# Patient Record
Sex: Female | Born: 1945 | Race: White | Hispanic: No | Smoking: Current every day smoker
Health system: Southern US, Community
[De-identification: ages and names within clinical notes are randomized; demographics above are authoritative.]

## PROBLEM LIST (undated history)

## (undated) DIAGNOSIS — F17200 Nicotine dependence, unspecified, uncomplicated: Secondary | ICD-10-CM

## (undated) DIAGNOSIS — C801 Malignant (primary) neoplasm, unspecified: Secondary | ICD-10-CM

## (undated) DIAGNOSIS — E785 Hyperlipidemia, unspecified: Secondary | ICD-10-CM

## (undated) DIAGNOSIS — I10 Essential (primary) hypertension: Secondary | ICD-10-CM

## (undated) HISTORY — PX: NO PAST SURGERIES: SHX2092

## (undated) HISTORY — DX: Essential (primary) hypertension: I10

---

## 2012-10-06 ENCOUNTER — Ambulatory Visit (INDEPENDENT_AMBULATORY_CARE_PROVIDER_SITE_OTHER): Payer: Self-pay | Admitting: Sports Medicine

## 2012-10-06 ENCOUNTER — Encounter: Payer: Self-pay | Admitting: Sports Medicine

## 2012-10-06 VITALS — BP 194/130 | Ht 60.0 in | Wt 147.0 lb

## 2012-10-06 DIAGNOSIS — M25569 Pain in unspecified knee: Secondary | ICD-10-CM

## 2012-10-06 DIAGNOSIS — M542 Cervicalgia: Secondary | ICD-10-CM

## 2012-10-06 NOTE — Progress Notes (Signed)
HPI:  Patient is a 67 y/o female with reportedly no significant PMH [facility] with complains of bilateral posterior knee pain and right arm pain.  She states that both have been hurting for over a year.  She has difficulty describing her pain.  States that right arm pain is exacerbated by lifting heavy objects and she sometimes feels that the pain starts in the right side of her neck.  Denies weakness, swelling, numbness.  Patient also complains of bilateral polpliteal pain which she states is worsened by walking up stairs and fully extending knees.  She states she has been taking Advil and Bengay with some relief in her pain.  She denies any radiation of her knee pain.  Denies numbness.  She is unable to think of any inciting injuries.  ROS: As above.  Physical exam: General: well-developed, well-nourished; NAD Right arm: Empty can, Hawkin's, O'Brien's tests negative.  Patient has full range of motion of arm and 5/5 strength.  No warmth or swelling noted.  Neurovascularly intact distally.  Patient reports mild TTP of right trapezius and right paraspinal cervical tenderness.  Spurling's test negative. Knees: Lachman's, McMurray's tests negative bilaterally.  Minimal crepitus noted on flexion and extension of knees.  Patient has 5/5 strength and 2/2 DTR's of lower extremities bilaterally.  Knees stable to varus and valgus testing.  Patient reports mild TTP of popliteal fossas bilaterally.  Neurovascularly intact distally.  A/P: Right arm pain: Working Dx - Rotator cuff tendinopathy vs cervical radiculopathy.  MSK exam of right arm and neck benign.  However will order AP/lateral views of cervical spine to further assess.  Patient has been encouraged to follow up with Rudell Cobb regarding orange card as she doe not have insurance.   Knee pain: Knee pain exam is normal, however given age and elevated BMI, possible that patient has some degree of degenerative disease.  Have ordered  AP/lateral views to evaluate knees bilaterally. Patient to return to clinic to follow up after she has procured orange card.

## 2012-10-13 ENCOUNTER — Ambulatory Visit: Payer: Self-pay | Admitting: Family Medicine

## 2015-03-21 ENCOUNTER — Emergency Department (HOSPITAL_COMMUNITY): Payer: Medicare Other

## 2015-03-21 ENCOUNTER — Emergency Department (HOSPITAL_COMMUNITY)
Admission: EM | Admit: 2015-03-21 | Discharge: 2015-03-21 | Disposition: A | Discharge status: Home / Self Care | Payer: Medicare Other | Attending: Emergency Medicine | Admitting: Emergency Medicine

## 2015-03-21 ENCOUNTER — Encounter (HOSPITAL_COMMUNITY): Payer: Self-pay | Admitting: Family Medicine

## 2015-03-21 DIAGNOSIS — I10 Essential (primary) hypertension: Secondary | ICD-10-CM

## 2015-03-21 DIAGNOSIS — R109 Unspecified abdominal pain: Secondary | ICD-10-CM | POA: Insufficient documentation

## 2015-03-21 DIAGNOSIS — E780 Pure hypercholesterolemia: Secondary | ICD-10-CM | POA: Diagnosis not present

## 2015-03-21 DIAGNOSIS — M549 Dorsalgia, unspecified: Secondary | ICD-10-CM | POA: Diagnosis not present

## 2015-03-21 DIAGNOSIS — Z79899 Other long term (current) drug therapy: Secondary | ICD-10-CM | POA: Insufficient documentation

## 2015-03-21 DIAGNOSIS — Z720 Tobacco use: Secondary | ICD-10-CM | POA: Diagnosis not present

## 2015-03-21 LAB — I-STAT CHEM 8, ED
BUN: 27 mg/dL — ABNORMAL HIGH (ref 6–20)
CREATININE: 0.9 mg/dL (ref 0.44–1.00)
Calcium, Ion: 1.18 mmol/L (ref 1.13–1.30)
Chloride: 100 mmol/L — ABNORMAL LOW (ref 101–111)
GLUCOSE: 94 mg/dL (ref 65–99)
HCT: 43 % (ref 36.0–46.0)
HEMOGLOBIN: 14.6 g/dL (ref 12.0–15.0)
POTASSIUM: 4.2 mmol/L (ref 3.5–5.1)
SODIUM: 140 mmol/L (ref 135–145)
TCO2: 27 mmol/L (ref 0–100)

## 2015-03-21 LAB — URINALYSIS, ROUTINE W REFLEX MICROSCOPIC
Bilirubin Urine: NEGATIVE
Glucose, UA: NEGATIVE mg/dL
Ketones, ur: NEGATIVE mg/dL
NITRITE: NEGATIVE
Protein, ur: 30 mg/dL — AB
SPECIFIC GRAVITY, URINE: 1.025 (ref 1.005–1.030)
UROBILINOGEN UA: 0.2 mg/dL (ref 0.0–1.0)
pH: 5.5 (ref 5.0–8.0)

## 2015-03-21 LAB — URINE MICROSCOPIC-ADD ON

## 2015-03-21 MED ORDER — ACETAMINOPHEN 500 MG PO TABS
1000.0000 mg | ORAL_TABLET | Freq: Once | ORAL | Status: AC
  Administered 2015-03-21: 1000 mg via ORAL
  Filled 2015-03-21: qty 2

## 2015-03-21 NOTE — ED Provider Notes (Signed)
CSN: [id number]     Arrival date & time 03/21/15  0804 History   First MD Initiated Contact with Patient 03/21/15 0912     Chief Complaint  Patient presents with  . Back Pain     (Consider location/radiation/quality/duration/timing/severity/associated sxs/prior Treatment) HPI Comments: 69 year old female with high blood pressure and cholesterol history presents with intermittent back pain usually worse with position the past month. Patient recently started on high blood pressure medication she is taking regularly, cigarette smoker. No surgery history no aneurysm history known, no weakness or numbness in the legs. No shortness of breath or chest pain. Blood pressure has been fluctuating. Nothing specifically improves or worsens  Patient is a 69 y.o. female presenting with back pain. The history is provided by the patient.  Back Pain Associated symptoms: no abdominal pain, no chest pain, no dysuria, no fever and no headaches     History reviewed. No pertinent past medical history. History reviewed. No pertinent past surgical history. No family history on file. History  Substance Use Topics  . Smoking status: Current Some Day Smoker    Types: Cigarettes  . Smokeless tobacco: Not on file  . Alcohol Use: Not on file   OB History    No data available     Review of Systems  Constitutional: Negative for fever and chills.  HENT: Negative for congestion.   Eyes: Negative for visual disturbance.  Respiratory: Negative for shortness of breath.   Cardiovascular: Negative for chest pain.  Gastrointestinal: Negative for vomiting and abdominal pain.  Genitourinary: Negative for dysuria and flank pain.  Musculoskeletal: Positive for back pain. Negative for neck pain and neck stiffness.  Skin: Negative for rash.  Neurological: Negative for light-headedness and headaches.      Allergies  Review of patient's allergies indicates no known allergies.  Home Medications   Prior to Admission  medications   Medication Sig Start Date End Date Taking? Authorizing Provider  ibuprofen (ADVIL,MOTRIN) 200 MG tablet Take 200 mg by mouth every 6 (six) hours as needed (pain).   Yes Historical Provider, MD  lisinopril-hydrochlorothiazide (PRINZIDE,ZESTORETIC) 20-25 MG per tablet Take 1 tablet by mouth daily. 03/10/15  Yes Historical Provider, MD  lovastatin (MEVACOR) 10 MG tablet Take 10 mg by mouth daily. 03/10/15  Yes Historical Provider, MD  Multiple Vitamins-Minerals (MULTIVITAMIN PO) Take 1 tablet by mouth daily.   Yes Historical Provider, MD  Omega-3 Fatty Acids (FISH OIL PO) Take 1 tablet by mouth 2 (two) times daily.   Yes Historical Provider, MD   BP 166/89 mmHg  Pulse 99  Temp(Src) 97.8 F (36.6 C)  Resp 17  Ht 5\' 3"  (1.6 m)  Wt 152 lb (68.947 kg)  BMI 26.93 kg/m2  SpO2 97% Physical Exam  Constitutional: She is oriented to person, place, and time. She appears well-developed and well-nourished.  HENT:  Head: Normocephalic and atraumatic.  Eyes: Conjunctivae are normal. Right eye exhibits no discharge. Left eye exhibits no discharge.  Neck: Normal range of motion. Neck supple. No tracheal deviation present.  Cardiovascular: Normal rate and regular rhythm.   Pulmonary/Chest: Effort normal and breath sounds normal.  Abdominal: Soft. She exhibits no distension. There is no tenderness. There is no guarding.  Musculoskeletal: She exhibits tenderness. She exhibits no edema.  Mild right SI joint tenderness to palpation, patient has good flexion extension. No midline lumbar tenderness  Neurological: She is alert and oriented to person, place, and time.  Reflex Scores:      Patellar reflexes are 2+  on the right side and 2+ on the left side.      Achilles reflexes are 1+ on the right side and 1+ on the left side. Patient has 5+ strength with flexion extension of major joints in the lower extremity, sensation intact and major nerve distributions  Skin: Skin is warm. No rash noted.   Psychiatric: She has a normal mood and affect.  Nursing note and vitals reviewed.   ED Course  Procedures (including critical care time) Labs Review Labs Reviewed  URINALYSIS, ROUTINE W REFLEX MICROSCOPIC (NOT AT [facility]) - Abnormal; Notable for the following:    APPearance HAZY (*)    Hgb urine dipstick TRACE (*)    Protein, ur 30 (*)    Leukocytes, UA TRACE (*)    All other components within normal limits  URINE MICROSCOPIC-ADD ON - Abnormal; Notable for the following:    Squamous Epithelial / LPF FEW (*)    Casts HYALINE CASTS (*)    All other components within normal limits  I-STAT CHEM 8, ED - Abnormal; Notable for the following:    Chloride 100 (*)    BUN 27 (*)    All other components within normal limits    Imaging Review Ct Renal Stone Study  03/21/2015   CLINICAL DATA:  Right flank pain 1 month  EXAM: CT ABDOMEN AND PELVIS WITHOUT CONTRAST  TECHNIQUE: Multidetector CT imaging of the abdomen and pelvis was performed following the standard protocol without IV contrast.  COMPARISON:  None.  FINDINGS: Lower chest: Mild scarring in the lingula. Lung bases otherwise clear  Hepatobiliary: Probable fatty infiltration of the liver without focal liver lesion. Gallbladder and bile ducts normal.  Pancreas: Negative  Spleen: Negative  Adrenals/Urinary Tract: Negative for renal obstruction. No urinary tract calculi. No mass lesion. Urinary bladder is empty and appears normal.  Stomach/Bowel: Negative for bowel obstruction. No bowel edema. Terminal ileum normal. Appendix not visualized but no edema in the right lower quadrant identified.  Vascular/Lymphatic: Mild atherosclerotic calcification in the abdominal aorta and iliac arteries without aneurysm.  Reproductive: Normal-sized uterus.  No adnexal mass.  Other: Negative for adenopathy.  No free fluid.  Musculoskeletal: Lumbar degenerative changes. Mild anterior slip L3-4. No acute bony abnormality.  IMPRESSION: Negative for urinary tract  calculi. No acute abnormality identified.   Electronically Signed   By: Franchot Gallo M.D.   On: 03/21/2015 10:01     EKG Interpretation None      MDM   Final diagnoses:  Back pain  Essential hypertension   Patient presents with elevated blood pressure and back pain. Back pain most likely musculoskeletal intermittent mild reproducible. CT scan done to look for kidney stone or AAA, no acute findings reviewed results. Vitals unremarkable except for mild elevated blood pressure for which patient has outpatient follow-up for  Patient stable for outpatient follow blood work reviewed.  Results and differential diagnosis were discussed with the patient/parent/guardian. Xrays were independently reviewed by myself.  Close follow up outpatient was discussed, comfortable with the plan.   Medications  acetaminophen (TYLENOL) tablet 1,000 mg (1,000 mg Oral Given 03/21/15 1005)    Filed Vitals:   03/21/15 0808  BP: 166/89  Pulse: 99  Temp: 97.8 F (36.6 C)  Resp: 17  Height: 5\' 3"  (1.6 m)  Weight: 152 lb (68.947 kg)  SpO2: 97%    Final diagnoses:  Back pain  Essential hypertension          Elnora Morrison, MD 03/21/15 1035

## 2015-03-21 NOTE — Discharge Instructions (Signed)
Take Tylenol as needed for pain. Follow-up closely with your primary doctor to adjust blood pressure medications.  If you were given medicines take as directed.  If you are on coumadin or contraceptives realize their levels and effectiveness is altered by many different medicines.  If you have any reaction (rash, tongues swelling, other) to the medicines stop taking and see a physician.    If your blood pressure was elevated in the ER make sure you follow up for management with a primary doctor or return for chest pain, shortness of breath or stroke symptoms.  Please follow up as directed and return to the ER or see a physician for new or worsening symptoms.  Thank you. Filed Vitals:   03/21/15 0808  BP: 166/89  Pulse: 99  Temp: 97.8 F (36.6 C)  Resp: 17  Height: 5\' 3"  (1.6 m)  Weight: 152 lb (68.947 kg)  SpO2: 97%

## 2015-03-21 NOTE — ED Notes (Signed)
SW to come talk to pt about PCP. Pt reports hers will no longer see her.

## 2015-03-21 NOTE — ED Notes (Signed)
Patient transported to CT 

## 2015-03-21 NOTE — ED Notes (Signed)
Pt. Stated, I've had back pain for about a month, and I wan to check my BP .

## 2015-07-02 ENCOUNTER — Other Ambulatory Visit: Payer: Self-pay | Admitting: Internal Medicine

## 2015-07-02 DIAGNOSIS — E2839 Other primary ovarian failure: Secondary | ICD-10-CM

## 2015-08-01 ENCOUNTER — Other Ambulatory Visit: Payer: Self-pay

## 2015-08-01 DIAGNOSIS — Z1231 Encounter for screening mammogram for malignant neoplasm of breast: Secondary | ICD-10-CM

## 2015-08-03 ENCOUNTER — Other Ambulatory Visit: Payer: Medicare Other

## 2015-09-06 ENCOUNTER — Ambulatory Visit
Admission: RE | Admit: 2015-09-06 | Discharge: 2015-09-06 | Disposition: A | Discharge status: Home / Self Care | Payer: Medicare Other | Source: Ambulatory Visit | Attending: Internal Medicine | Admitting: Internal Medicine

## 2015-09-06 ENCOUNTER — Ambulatory Visit
Admission: RE | Admit: 2015-09-06 | Discharge: 2015-09-06 | Disposition: A | Discharge status: Home / Self Care | Payer: Medicare Other | Source: Ambulatory Visit

## 2015-09-06 DIAGNOSIS — E2839 Other primary ovarian failure: Secondary | ICD-10-CM

## 2015-09-06 DIAGNOSIS — Z1231 Encounter for screening mammogram for malignant neoplasm of breast: Secondary | ICD-10-CM

## 2017-06-28 ENCOUNTER — Other Ambulatory Visit: Payer: Self-pay | Admitting: Internal Medicine

## 2017-06-28 DIAGNOSIS — L309 Dermatitis, unspecified: Secondary | ICD-10-CM

## 2017-07-15 ENCOUNTER — Other Ambulatory Visit: Payer: Self-pay | Admitting: Internal Medicine

## 2017-07-15 DIAGNOSIS — L309 Dermatitis, unspecified: Secondary | ICD-10-CM

## 2017-08-12 ENCOUNTER — Ambulatory Visit
Admission: RE | Admit: 2017-08-12 | Discharge: 2017-08-12 | Disposition: A | Discharge status: Home / Self Care | Payer: Medicare Other | Source: Ambulatory Visit | Attending: Internal Medicine | Admitting: Internal Medicine

## 2017-08-12 DIAGNOSIS — L309 Dermatitis, unspecified: Secondary | ICD-10-CM

## 2017-08-27 DIAGNOSIS — C801 Malignant (primary) neoplasm, unspecified: Secondary | ICD-10-CM

## 2017-08-27 HISTORY — DX: Malignant (primary) neoplasm, unspecified: C80.1

## 2017-09-02 ENCOUNTER — Other Ambulatory Visit: Payer: Self-pay | Admitting: General Surgery

## 2017-09-02 DIAGNOSIS — D493 Neoplasm of unspecified behavior of breast: Secondary | ICD-10-CM

## 2017-09-05 ENCOUNTER — Telehealth: Payer: Self-pay | Admitting: Hematology

## 2017-09-05 ENCOUNTER — Other Ambulatory Visit: Payer: Self-pay | Admitting: General Surgery

## 2017-09-05 DIAGNOSIS — D493 Neoplasm of unspecified behavior of breast: Secondary | ICD-10-CM

## 2017-09-05 NOTE — Telephone Encounter (Signed)
With patients daughter in regard to appointment D/T/Loc/Ph#.  Per message from Dr Burr Medico D/T

## 2017-09-09 ENCOUNTER — Other Ambulatory Visit: Payer: Medicare Other

## 2017-09-09 ENCOUNTER — Ambulatory Visit
Admission: RE | Admit: 2017-09-09 | Discharge: 2017-09-09 | Disposition: A | Discharge status: Home / Self Care | Payer: Medicare Other | Source: Ambulatory Visit | Attending: General Surgery | Admitting: General Surgery

## 2017-09-09 DIAGNOSIS — D493 Neoplasm of unspecified behavior of breast: Secondary | ICD-10-CM

## 2017-09-09 DIAGNOSIS — C50112 Malignant neoplasm of central portion of left female breast: Secondary | ICD-10-CM | POA: Insufficient documentation

## 2017-09-09 MED ORDER — GADOBENATE DIMEGLUMINE 529 MG/ML IV SOLN
14.0000 mL | Freq: Once | INTRAVENOUS | Status: AC | PRN
  Administered 2017-09-09: 14 mL via INTRAVENOUS

## 2017-09-09 NOTE — Progress Notes (Signed)
[place]  [phone] Fax:([phone]) [place] Note   Patient Care Team: Nolene Ebbs, MD as PCP - General (Internal Medicine) Fanny Skates, MD as Consulting Physician (General Surgery) Truitt Merle, MD as Consulting Physician (Hematology) 09/10/2017  REFERRAL PHYSICIAN: Dr. 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 The skin thickening and hypervascularity in the region of physical exam abnormality is suspicious for Paget's disease. No mass seen within the breast parenchyma.      08/30/2017 Pathology Results    Diagnosis Breast, biopsy, left   - PAGET'S DISEASE WITH FOCAL SUPERFICIAL INVASION AND DUCTAL CARCINOMA IN SITU INVOLVING LACTIFEROUS DUCTS - DERMAL-EPIDERMAL INTERFACE REACTION WITH LICHENOID INFLAMMATION AND SCATTERED PIGMENTED MELANOPHAGES - ER AND PR WILL BE ORDERED ON BLOCK 1A AND SEPARATELY REPORTED - SEE COMMENT      08/30/2017 Receptors her2    ER, 0% negative and PR, 0% negative, HER2, pending        09/09/2017 Initial Diagnosis    Ductal carcinoma in situ (DCIS) of left breast      09/09/2017 Imaging    BILATERAL BREAST MRI WITH AND WITHOUT CONTRAST IMPRESSION: 1. Known biopsy-proven Paget's disease of the LEFT nipple. There is associated enhancement and thickening of the left areola and periareolar skin. The periareolar skin thickening and enhancement extends approximately 3-3.5 cm medial and lateral to the nipple defining the extent of skin involvement. 2. Focal non-mass enhancement within the immediate subareolar LEFT breast, measuring 1.9 x 1.5 cm, consistent with direct subareolar extension of patient's biopsy-proven Paget's disease. 3. No evidence of additional  multifocal or multicentric disease within the left breast. 4. No evidence of malignancy within the right breast. 5. No evidence of metastatic lymphadenopathy.        HISTORY OF PRESENTING ILLNESS:  Denise Mcclure 72 y.o. 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 She presents with her daughter. She was referred her by her surgeon, Dr. Dalbert Batman. Pt first notices a skin changed 1 year ago. She noticed that her left nipple was peeling and itching with no discharge. It has not increased in since and she has no pain currently.   Pt has HTN and HLD and she takes livostin and losartan prescribed by her PCP, Dr. Margy Clarks. She also takes Fosamax weekly. She sees him every 6 months and gets routine blood work. She reports no recent abnormal results. No hx of DM, CAD, COPD, CKD and no hx of surgery. She has no FHx of CA. She does have a FHx of DM, and HTN. Pt smokes 1/4 pack of cigarettes a day, she is try to quit. She does not drink alcohol.    Pt had a mammogram on 08/12/17 with results inially revealing that the physical exam is suspicious for Paget's disease in the left Nipple/areola, the skin thickening and hypervascularity in the region of physical exam abnormality is suspicious for Paget's Disease and that there was no mass seen within the breast parenchyma. Pt had a sample biopsied on 08/26/17 with results revealing Paget's Disease with focal superficial invasion and ductal carcinoma in situ involving lactiferous ducts in her left breast.   Her breast MRI on 09/09/17 reveals known biopsy-proven Paget's disease of the LEFT nipple. There  is associated enhancement and thickening of the left areola and periareolar skin. The periareolar skin thickening and enhancement extends approximately 3-3.5 cm medial and lateral to the nipple defining the extent of skin involvement. There is focal non-mass enhancement within the immediate subareolar LEFT breast, measuring  1.9 x 1.5 cm, consistent with direct subareolar extension of patient's biopsy-proven Paget's disease. There is no evidence of additional multifocal or multicentric disease within the left breast, malignancy within the right breast or metastatic lymphadenopathy.  Prognostic indicators significant for: ER, 0% negative and PR, 0% negative  On review of systems, pt denies chest pain, abdominal pain Pertinent positives are listed and detailed within the above HPI.    MEDICAL HISTORY:  Past Medical History:  Diagnosis Date  . Hypertension     SURGICAL HISTORY: History reviewed. No pertinent surgical history.  SOCIAL HISTORY: Social History   Socioeconomic History  . Marital status: Widowed    Spouse name: Not on file  . Number of children: Not on file  . Years of education: Not on file  . Highest education level: Not on file  Social Needs  . Financial resource strain: Not on file  . Food insecurity - worry: Not on file  . Food insecurity - inability: Not on file  . Transportation needs - medical: Not on file  . Transportation needs - non-medical: Not on file  Occupational History  . Not on file  Tobacco Use  . Smoking status: Current Some Day Smoker    Packs/day: 0.25    Types: Cigarettes  . Smokeless tobacco: Never Used  Substance and Sexual Activity  . Alcohol use: No    Frequency: Never  . Drug use: Not on file  . Sexual activity: Not on file  Other Topics Concern  . Not on file  Social History Narrative  . Not on file    FAMILY HISTORY: Family History  Problem Relation Age of Onset  . Diabetes Sister   . Diabetes Brother     ALLERGIES:  has No Known Allergies.  MEDICATIONS:  Current Outpatient Medications  Medication Sig Dispense Refill  . alendronate (FOSAMAX) 70 MG tablet Take 70 mg by mouth once a week.  2  . ibuprofen (ADVIL,MOTRIN) 200 MG tablet Take 200 mg by mouth every 6 (six) hours as needed (pain).    Marland Kitchen losartan-hydrochlorothiazide (HYZAAR)  50-12.5 MG tablet Take 1 tablet by mouth daily.  1  . lovastatin (MEVACOR) 10 MG tablet Take 10 mg by mouth daily.  3  . Omega-3 Fatty Acids (FISH OIL PO) Take 1 tablet by mouth 2 (two) times daily.    . Multiple Vitamins-Minerals (MULTIVITAMIN PO) Take 1 tablet by mouth daily.    Marland Kitchen zolpidem (AMBIEN) 10 MG tablet Take 1 tablet by mouth at bedtime as needed.  2   No current facility-administered medications for this visit.     REVIEW OF SYSTEMS:   Constitutional: Denies fevers, chills or abnormal night sweats Eyes: Denies blurriness of vision, double vision or watery eyes Ears, nose, mouth, throat, and face: Denies mucositis or sore throat Respiratory: Denies cough, dyspnea or wheezes Cardiovascular: Denies palpitation, chest discomfort or lower extremity swelling Gastrointestinal:  Denies nausea, heartburn or change in bowel habits Skin: Denies abnormal skin rashes Lymphatics: Denies new lymphadenopathy or easy bruising Neurological:Denies numbness, tingling or new weaknesses Behavioral/Psych: Mood is stable, no new changes  All other systems were reviewed with the patient and are negative.  PHYSICAL EXAMINATION: ECOG PERFORMANCE STATUS: 0 - Asymptomatic  Vitals:   09/10/17 1120  BP: (!) 148/90  Pulse: 100  Resp: 17  Temp: 98.6 F (37 C)  SpO2: 97%   Filed Weights   09/10/17 1120  Weight: 159 lb 8 oz (72.3 kg)    GENERAL:alert, no distress and comfortable SKIN: skin color, texture, turgor are normal, no rashes or significant lesions EYES: normal, conjunctiva are pink and non-injected, sclera clear OROPHARYNX:no exudate, no erythema and lips, buccal mucosa, and tongue normal  NECK: supple, thyroid normal size, non-tender, without nodularity LYMPH:  no palpable lymphadenopathy in the cervical, axillary or inguinal LUNGS: clear to auscultation and percussion with normal breathing effort HEART: regular rate & rhythm and no murmurs and no lower extremity  edema ABDOMEN:abdomen soft, non-tender and normal bowel sounds Musculoskeletal:no cyanosis of digits and no clubbing  PSYCH: alert & oriented x 3 with fluent speech NEURO: no focal motor/sensory deficits BREAST: Left breast with 5 cm by 6.5 cm area of significant skin hyperpigmentation with scalping around nipple. No discharge or bleeding. No palpable masses or adenopathy in either breast and aaxilla  LABORATORY DATA:  I have reviewed the data as listed CBC Latest Ref Rng & Units 03/21/2015  Hemoglobin 12.0 - 15.0 g/dL 14.6  Hematocrit 36.0 - 46.0 % 43.0    CMP Latest Ref Rng & Units 03/21/2015  Glucose 65 - 99 mg/dL 94  BUN 6 - 20 mg/dL 27(H)  Creatinine 0.44 - 1.00 mg/dL 0.90  Sodium 135 - 145 mmol/L 140  Potassium 3.5 - 5.1 mmol/L 4.2  Chloride 101 - 111 mmol/L 100(L)     PATHOLOGY: 08/30/2017 Diagnosis Breast, biopsy, left   - PAGET'S DISEASE WITH FOCAL SUPERFICIAL INVASION AND DUCTAL CARCINOMA IN SITU INVOLVING LACTIFEROUS DUCTS - DERMAL-EPIDERMAL INTERFACE REACTION WITH LICHENOID INFLAMMATION AND SCATTERED PIGMENTED MELANOPHAGES - ER AND PR WILL BE ORDERED ON BLOCK 1A AND SEPARATELY REPORTED - SEE COMMENT  RADIOGRAPHIC STUDIES: I have personally reviewed the radiological images as listed and agreed with the findings in the report. Mr Breast Bilateral W Wo Contrast Inc Cad  Result Date: 09/09/2017 CLINICAL DATA:  Patient presented on 08/12/2017 for a diagnostic mammogram and ultrasound complaining of discoloration and scaliness of the left nipple and areola for 9-12 months. Ultrasound showed associated skin thickening at the areola with hypervascularity. Punch biopsy was recommended and subsequently performed revealing Paget's disease with focal superficial invasion and DCIS. LABS:  GFR 87, creatinine 0.7 EXAM: BILATERAL BREAST MRI WITH AND WITHOUT CONTRAST TECHNIQUE: Multiplanar, multisequence MR images of both breasts were obtained prior to and following the intravenous  administration of 14 ml of MultiHance. THREE-DIMENSIONAL MR IMAGE RENDERING ON INDEPENDENT WORKSTATION: Three-dimensional MR images were rendered by post-processing of the original MR data on an independent workstation. The three-dimensional MR images were interpreted, and findings are reported in the following complete MRI report for this study. Three dimensional images were evaluated at the independent DynaCad workstation COMPARISON:  Previous exam(s). FINDINGS: Study is slightly limited by patient motion artifact. Breast composition: b. Scattered fibroglandular tissue. Background parenchymal enhancement: Moderate. Right breast: No mass or abnormal enhancement. Left breast: There is focal non-mass enhancement within the immediate subareolar left breast, measuring 1.9 x 1.5 cm (AP by transverse dimensions), with sub threshold enhancement kinetics, consistent with subareolar extension of patient's biopsy-proven Paget's disease (series 7, image 87). There is areolar and periareolar skin enhancement, with associated skin thickening. The periareolar extension measures approximately 3-3.5 cm medial and lateral to the nipple. With motion correction on DynaCAD images, there are no  additional suspicious enhancement within the left breast. Lymph nodes: No enlarged or morphologically abnormal lymph nodes are identified within either axillary or internal mammary chain region. Ancillary findings:  None. IMPRESSION: 1. Known biopsy-proven Paget's disease of the LEFT nipple. There is associated enhancement and thickening of the left areola and periareolar skin. The periareolar skin thickening and enhancement extends approximately 3-3.5 cm medial and lateral to the nipple defining the extent of skin involvement. 2. Focal non-mass enhancement within the immediate subareolar LEFT breast, measuring 1.9 x 1.5 cm, consistent with direct subareolar extension of patient's biopsy-proven Paget's disease. 3. No evidence of additional  multifocal or multicentric disease within the left breast. 4. No evidence of malignancy within the right breast. 5. No evidence of metastatic lymphadenopathy. RECOMMENDATION: Per treatment plan for patient's known left-sided Paget's disease. BI-RADS CATEGORY  6: Known biopsy-proven malignancy. Electronically Signed   By: Franki Cabot M.D.   On: 09/09/2017 12:50   US Breast Ltd Uni Left Inc Axilla  Result Date: 08/12/2017 CLINICAL DATA:  Severe discoloration and scaliness of the left nipple and areola. EXAM: 2D DIGITAL DIAGNOSTIC LEFT MAMMOGRAM WITH CAD AND ADJUNCT TOMO ULTRASOUND LEFT BREAST COMPARISON:  Previous exam(s). ACR Breast Density Category b: There are scattered areas of fibroglandular density. FINDINGS: No mammographic abnormalities. Mammographic images were processed with CAD. On physical exam, there is severe discoloration of the left areola and nipple with scaliness. No lumps. Targeted ultrasound is performed, showing skin thickening in the areola with hypervascularity. No masses identified. IMPRESSION: The physical exam is suspicious for Paget's disease in the left nipple/areola. The skin thickening and hypervascularity in the region of physical exam abnormality is suspicious for Paget's disease. No mass seen within the breast parenchyma. RECOMMENDATION: Recommend punch biopsy of the abnormal left areola. I have discussed the findings and recommendations with the patient. Results were also provided in writing at the conclusion of the visit. If applicable, a reminder letter will be sent to the patient regarding the next appointment. BI-RADS CATEGORY  4: Suspicious. Electronically Signed   By: Dorise Bullion III M.D   On: 08/12/2017 10:22   Mm Diag Breast Tomo Bilateral  Result Date: 08/12/2017 CLINICAL DATA:  Severe discoloration and scaliness of the left nipple and areola. EXAM: 2D DIGITAL DIAGNOSTIC LEFT MAMMOGRAM WITH CAD AND ADJUNCT TOMO ULTRASOUND LEFT BREAST COMPARISON:  Previous  exam(s). ACR Breast Density Category b: There are scattered areas of fibroglandular density. FINDINGS: No mammographic abnormalities. Mammographic images were processed with CAD. On physical exam, there is severe discoloration of the left areola and nipple with scaliness. No lumps. Targeted ultrasound is performed, showing skin thickening in the areola with hypervascularity. No masses identified. IMPRESSION: The physical exam is suspicious for Paget's disease in the left nipple/areola. The skin thickening and hypervascularity in the region of physical exam abnormality is suspicious for Paget's disease. No mass seen within the breast parenchyma. RECOMMENDATION: Recommend punch biopsy of the abnormal left areola. I have discussed the findings and recommendations with the patient. Results were also provided in writing at the conclusion of the visit. If applicable, a reminder letter will be sent to the patient regarding the next appointment. BI-RADS CATEGORY  4: Suspicious. Electronically Signed   By: Dorise Bullion III M.D   On: 08/12/2017 10:22    ASSESSMENT & PLAN: 72 yo female, originally from [place]   1. Paget's disease and ductal carcinoma in situ of left breast -- I discussed her pathology and radiology findings in detail. I went over the  pathology with the patient in detail. We discussed her Paget's Disease and the non-invasive nature of the DCIS.  -Pathology results from 08/26/17 reveal Paget's Disease with focal superficial invasion and ductal carcinoma in situ involving lactiferous ducts in her left breast. I discussed that her DCIS was negative for PR and ER receptors. -Breast MRI on 09/09/17 reveals known biopsy-proven Paget's disease of the LEFT nipple. There is associated enhancement and thickening of the left areola and periareolar skin.  The image finding is consistent with Paget's disease, no additional suspicious image findings for malignancy.  malignancy. I discussed this result with pt. --I  will be meeting with surgeon and pathologist tomorrow in breast tumor board and and we will discuss the case. I again discussed that if she elects with a lumpectomy, she will probably need radiation. If she elects to have a mastectomy, she will not need radiation. I discussed recovery time and the cosmetics of both options. They will take some time to research and discuss.  -we discussed that DCIS will be completely cured by surgical resection. But she will be at high risk for future breast cancer -we also discussed that biopsy may have sampling error, and we will review her surgical path to rule out invasive cancer -due to negative ER and PR, she would not need adjuvant antiestrogen therapy  -F/u with me after surgery if surgical path shows invasive cancer, otherwise after radiation   PLAN: -Will discuss her case at breast conference tomorrow, and discuss surgical options (lumpectomy vs mastectomy with sentinel node biopsy). I spoke with Dr. Wyonia Hough today  -Will follow up after surgery or radiation    No orders of the defined types were placed in this encounter.   All questions were answered. The patient knows to call the clinic with any problems, questions or concerns. I spent 40 minutes counseling the patient face to face. The total time spent in the appointment was 77 and more than 50% was on counseling.  This document serves as a record of services personally performed by Truitt Merle, MD. It was created on her behalf by Theresia Bough, a trained medical scribe. The creation of this record is based on the scribe's personal observations and the provider's statements to them.   I have reviewed the above documentation for accuracy and completeness, and I agree with the above.      Truitt Merle, MD 09/10/2017

## 2017-09-10 ENCOUNTER — Encounter: Payer: Self-pay | Admitting: Hematology

## 2017-09-10 ENCOUNTER — Inpatient Hospital Stay: Payer: Medicare Other | Attending: Hematology | Admitting: Hematology

## 2017-09-10 ENCOUNTER — Telehealth: Payer: Self-pay | Admitting: Hematology

## 2017-09-10 DIAGNOSIS — Z171 Estrogen receptor negative status [ER-]: Secondary | ICD-10-CM | POA: Insufficient documentation

## 2017-09-10 DIAGNOSIS — Z720 Tobacco use: Secondary | ICD-10-CM | POA: Diagnosis not present

## 2017-09-10 DIAGNOSIS — D0512 Intraductal carcinoma in situ of left breast: Secondary | ICD-10-CM

## 2017-09-10 NOTE — Telephone Encounter (Signed)
No 11/5 los.  °

## 2017-09-11 ENCOUNTER — Encounter: Payer: Self-pay | Admitting: *Deleted

## 2017-09-24 ENCOUNTER — Other Ambulatory Visit: Payer: Self-pay | Admitting: General Surgery

## 2017-09-24 DIAGNOSIS — Z171 Estrogen receptor negative status [ER-]: Principal | ICD-10-CM

## 2017-09-24 DIAGNOSIS — C50112 Malignant neoplasm of central portion of left female breast: Secondary | ICD-10-CM

## 2017-09-26 ENCOUNTER — Encounter: Payer: Self-pay | Admitting: Radiation Oncology

## 2017-09-26 NOTE — Progress Notes (Signed)
Location of Breast Cancer:Central portion of left breast in female  Histology per Pathology Report:   Diagnosis  08-30-17 Breast, biopsy, left   - PAGET'S DISEASE WITH FOCAL SUPERFICIAL INVASION AND DUCTAL CARCINOMA IN SITU INVOLVING LACTIFEROUS DUCTS - DERMAL-EPIDERMAL INTERFACE REACTION WITH LICHENOID INFLAMMATION AND SCATTERED PIGMENTED MELANOPHAGES - ER AND PR WILL BE ORDERED ON BLOCK 1A AND SEPARATELY REPORTED - SEE COMMENT    Receptor Status: ER(0 % -), PR (0 % -), Her2-neu (), Ki-()  Did patient present with symptoms (if so, please note symptoms) or was this found on screening mammography?: Pt first notices a skin changed 1 year ago. She noticed that her left nipple was peeling and itching with no discharge.  Mammogram on 08/12/17 with results inially revealing that the physical exam is suspicious for Paget's disease in the left Nipple/areola.  Past/Anticipated interventions by surgeon, if any: Fanny Skates radiation referral 09-24-17  Past/Anticipated interventions by medical oncology, if any: Chemotherapy No,   09-10-17 Dr. Burr Medico Discussed at the Breast conference                                                                                                                                                                Will follow up after surgery or radiation      Lymphedema issues, if any: No Rom to right arm very good Skin to right breast normal color she hasn't had surgery. Pain issues, if any: No  SAFETY ISSUES:  Prior radiation?:No  Pacemaker/ICD? :No  Possible current pregnancy? :No  Is the patient on methotrexate? :No  Menarche 16   G 3P3  BC 20 years  Menopause 45    HRT No   Current Complaints / other details:     Wt Readings from Last 3 Encounters:  10/01/17 159 lb (72.1 kg)  09/10/17 159 lb 8 oz (72.3 kg)  03/21/15 152 lb (68.9 kg)  BP (!) 160/93 (BP Location: Right Arm, Patient Position: Sitting, Cuff Size: Normal)   Pulse 100   Temp 98.3 F  (36.8 C) (Oral)   Resp 18   Ht '5\' 3"'  (1.6 m)   Wt 159 lb (72.1 kg)   SpO2 100%   BMI 28.17 kg/m   Georgena Spurling, RN 09/26/2017,3:59 PM

## 2017-10-01 ENCOUNTER — Ambulatory Visit
Admission: RE | Admit: 2017-10-01 | Discharge: 2017-10-01 | Disposition: A | Discharge status: Home / Self Care | Payer: Medicare Other | Source: Ambulatory Visit | Attending: Radiation Oncology | Admitting: Radiation Oncology

## 2017-10-01 ENCOUNTER — Encounter: Payer: Self-pay | Admitting: Radiation Oncology

## 2017-10-01 ENCOUNTER — Other Ambulatory Visit: Payer: Self-pay

## 2017-10-01 DIAGNOSIS — Z833 Family history of diabetes mellitus: Secondary | ICD-10-CM | POA: Insufficient documentation

## 2017-10-01 DIAGNOSIS — Z79899 Other long term (current) drug therapy: Secondary | ICD-10-CM | POA: Insufficient documentation

## 2017-10-01 DIAGNOSIS — F17210 Nicotine dependence, cigarettes, uncomplicated: Secondary | ICD-10-CM | POA: Diagnosis not present

## 2017-10-01 DIAGNOSIS — C50012 Malignant neoplasm of nipple and areola, left female breast: Secondary | ICD-10-CM

## 2017-10-01 DIAGNOSIS — C50019 Malignant neoplasm of nipple and areola, unspecified female breast: Secondary | ICD-10-CM | POA: Insufficient documentation

## 2017-10-01 DIAGNOSIS — Z171 Estrogen receptor negative status [ER-]: Secondary | ICD-10-CM | POA: Diagnosis not present

## 2017-10-01 DIAGNOSIS — Z7983 Long term (current) use of bisphosphonates: Secondary | ICD-10-CM | POA: Diagnosis not present

## 2017-10-01 DIAGNOSIS — I10 Essential (primary) hypertension: Secondary | ICD-10-CM | POA: Diagnosis not present

## 2017-10-01 NOTE — Addendum Note (Signed)
Encounter addended by: Malena Edman, RN on: 10/01/2017 2:14 PM  Actions taken: Charge Capture section accepted

## 2017-10-01 NOTE — Progress Notes (Signed)
Radiation Oncology         [phone] ________________________________  Name: Denise Mcclure        MRN: [id number]  Date of Service: 10/01/2017 DOB: [birth year]  GY:BWLSLHT, Christean Grief, MD  Fanny Skates, MD     REFERRING PHYSICIAN: Fanny Skates, MD   DIAGNOSIS: There were no encounter diagnoses.   HISTORY OF PRESENT ILLNESS: Denise Mcclure is a 72 y.o. female seen in the multidisciplinary breast clinic for a new diagnosis of left breast cancer. The patient was noted to have thickening and itching of the left nipple area for about one year. She was seen for mammography and ultrasound which were negative for disease. 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 She has met with Dr. Burr Medico as well and is contemplating mastectomy versus lumpectomy with sentinel node evaluation. She has also undergone MRI scan on 09/09/17 revealing an area of non mass enhancement that measured 1.9 x 1.5 cm, and areolar and periareolar skin enhancement with associated thickening measuring 3-3.5 cm medial and lateral to the nipple.  No adenopathy was noted of either axilla, and her right breast was negative for enhancement.  She comes today to discuss the role for radiotherapy.    PREVIOUS RADIATION THERAPY: No   PAST MEDICAL HISTORY:  Past Medical History:  Diagnosis Date  . Hypertension        PAST SURGICAL HISTORY:No past surgical history on file.   FAMILY HISTORY:  Family History  Problem Relation Age of Onset  . Diabetes Sister   . Diabetes Brother      SOCIAL HISTORY:  reports that she has been smoking cigarettes.  She has been smoking about 0.25 packs per day. she has never used smokeless tobacco. She reports that she does not drink alcohol.   ALLERGIES: Patient has no known allergies.   MEDICATIONS:  Current Outpatient Medications  Medication Sig Dispense Refill  . alendronate (FOSAMAX)  70 MG tablet Take 70 mg by mouth once a week.  2  . losartan-hydrochlorothiazide (HYZAAR) 50-12.5 MG tablet Take 1 tablet by mouth daily.  1  . lovastatin (MEVACOR) 10 MG tablet Take 10 mg by mouth daily.  3  . Multiple Vitamins-Minerals (MULTIVITAMIN PO) Take 1 tablet by mouth daily.    . Omega-3 Fatty Acids (FISH OIL PO) Take 1 tablet by mouth 2 (two) times daily.    Marland Kitchen ibuprofen (ADVIL,MOTRIN) 200 MG tablet Take 200 mg by mouth every 6 (six) hours as needed (pain).    Marland Kitchen zolpidem (AMBIEN) 10 MG tablet Take 1 tablet by mouth at bedtime as needed.  2   No current facility-administered medications for this encounter.      REVIEW OF SYSTEMS: On review of systems, the patient reports that she is doing well overall. She denies any chest pain, shortness of breath, cough, fevers, chills, night sweats, unintended weight changes. She denies any bowel or bladder disturbances, and denies abdominal pain, nausea or vomiting. She denies any new musculoskeletal or joint aches or pains. A complete review of systems is obtained and is otherwise negative.     PHYSICAL EXAM:  Wt Readings from Last 3 Encounters:  10/01/17 159 lb (72.1 kg)  09/10/17 159 lb 8 oz (72.3 kg)  03/21/15 152 lb (68.9 kg)   Temp Readings from Last 3 Encounters:  10/01/17 98.3 F (36.8 C) (Oral)  09/10/17 98.6 F (37 C) (Oral)  03/21/15 97.8 F (36.6 C)  BP Readings from Last 3 Encounters:  10/01/17 (!) 160/93  09/10/17 (!) 148/90  03/21/15 136/79   Pulse Readings from Last 3 Encounters:  10/01/17 100  09/10/17 100  03/21/15 99     In general this is a well appearing [place] female in no acute distress. She is alert and oriented x4 and appropriate throughout the examination. HEENT reveals that the patient is normocephalic, atraumatic. EOMs are intact. PERRLA. Skin is intact without any evidence of gross lesions. Cardiopulmonary assessment is negative for acute distress and she exhibits normal effort.  Breast exam  is deferred to her postop visit.   ECOG = 1  0 - Asymptomatic (Fully active, able to carry on all predisease activities without restriction)  1 - Symptomatic but completely ambulatory (Restricted in physically strenuous activity but ambulatory and able to carry out work of a light or sedentary nature. For example, light housework, office work)  2 - Symptomatic, <50% in bed during the day (Ambulatory and capable of all self care but unable to carry out any work activities. Up and about more than 50% of waking hours)  3 - Symptomatic, >50% in bed, but not bedbound (Capable of only limited self-care, confined to bed or chair 50% or more of waking hours)  4 - Bedbound (Completely disabled. Cannot carry on any self-care. Totally confined to bed or chair)  5 - Death   Eustace Pen MM, Creech RH, Tormey DC, et al. [phone]). "Toxicity and response criteria of the [facility]". [place] Oncol. 5 (6): 649-55    LABORATORY DATA:  Lab Results  Component Value Date   HGB 14.6 03/21/2015   HCT 43.0 03/21/2015   Lab Results  Component Value Date   NA 140 03/21/2015   K 4.2 03/21/2015   CL 100 (L) 03/21/2015   No results found for: ALT, AST, GGT, ALKPHOS, BILITOT    RADIOGRAPHY: Mr Breast Bilateral W Wo Contrast Inc Cad  Result Date: 09/09/2017 CLINICAL DATA:  Patient presented on 08/12/2017 for a diagnostic mammogram and ultrasound complaining of discoloration and scaliness of the left nipple and areola for 9-12 months. Ultrasound showed associated skin thickening at the areola with hypervascularity. Punch biopsy was recommended and subsequently performed revealing Paget's disease with focal superficial invasion and DCIS. LABS:  GFR 87, creatinine 0.7 EXAM: BILATERAL BREAST MRI WITH AND WITHOUT CONTRAST TECHNIQUE: Multiplanar, multisequence MR images of both breasts were obtained prior to and following the intravenous administration of 14 ml of MultiHance. THREE-DIMENSIONAL MR  IMAGE RENDERING ON INDEPENDENT WORKSTATION: Three-dimensional MR images were rendered by post-processing of the original MR data on an independent workstation. The three-dimensional MR images were interpreted, and findings are reported in the following complete MRI report for this study. Three dimensional images were evaluated at the independent DynaCad workstation COMPARISON:  Previous exam(s). FINDINGS: Study is slightly limited by patient motion artifact. Breast composition: b. Scattered fibroglandular tissue. Background parenchymal enhancement: Moderate. Right breast: No mass or abnormal enhancement. Left breast: There is focal non-mass enhancement within the immediate subareolar left breast, measuring 1.9 x 1.5 cm (AP by transverse dimensions), with sub threshold enhancement kinetics, consistent with subareolar extension of patient's biopsy-proven Paget's disease (series 7, image 87). There is areolar and periareolar skin enhancement, with associated skin thickening. The periareolar extension measures approximately 3-3.5 cm medial and lateral to the nipple. With motion correction on DynaCAD images, there are no additional suspicious enhancement within the left breast. Lymph nodes: No enlarged or morphologically abnormal lymph  nodes are identified within either axillary or internal mammary chain region. Ancillary findings:  None. IMPRESSION: 1. Known biopsy-proven Paget's disease of the LEFT nipple. There is associated enhancement and thickening of the left areola and periareolar skin. The periareolar skin thickening and enhancement extends approximately 3-3.5 cm medial and lateral to the nipple defining the extent of skin involvement. 2. Focal non-mass enhancement within the immediate subareolar LEFT breast, measuring 1.9 x 1.5 cm, consistent with direct subareolar extension of patient's biopsy-proven Paget's disease. 3. No evidence of additional multifocal or multicentric disease within the left breast. 4. No  evidence of malignancy within the right breast. 5. No evidence of metastatic lymphadenopathy. RECOMMENDATION: Per treatment plan for patient's known left-sided Paget's disease. BI-RADS CATEGORY  6: Known biopsy-proven malignancy. Electronically Signed   By: Franki Cabot M.D.   On: 09/09/2017 12:50       IMPRESSION/PLAN: 1. 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 Dr. Lisbeth Renshaw discusses the pathology findings and reviews the nature of paget's disease of the breast. The consensus from the breast conference include breast conservation with lumpectomy with sentinel mapping versus mastectomy and sentinel mapping.  Her tumor does not appear to be invasive, and if she had a mastectomy, we do not anticipate her needing a course of adjuvant radiotherapy, however if she chose lumpectomy, we would recommend radiation following surgery. We discussed the risks, benefits, short, and long term effects of radiotherapy, and the patient is interested in proceeding. Dr. Lisbeth Renshaw discusses the delivery and logistics of radiotherapy and anticipates a course of 4 weeks.  If she proceeds with lumpectomy, we will see her back about 2 weeks after surgery to move forward with the simulation and planning process and anticipate starting radiotherapy about 4 weeks after surgery.   In a visit lasting 60 minutes, greater than 50% of the time was spent face to face discussing her case, and coordinating the patient's care.  The above documentation reflects my direct findings during this shared patient visit. Please see the separate note by Dr. Lisbeth Renshaw on this date for the remainder of the patient's plan of care.    Carola Rhine, PAC

## 2017-10-09 ENCOUNTER — Encounter (HOSPITAL_BASED_OUTPATIENT_CLINIC_OR_DEPARTMENT_OTHER): Payer: Self-pay | Admitting: *Deleted

## 2017-10-09 ENCOUNTER — Other Ambulatory Visit: Payer: Self-pay

## 2017-10-11 ENCOUNTER — Other Ambulatory Visit: Payer: Self-pay

## 2017-10-11 ENCOUNTER — Encounter (HOSPITAL_BASED_OUTPATIENT_CLINIC_OR_DEPARTMENT_OTHER)
Admission: RE | Admit: 2017-10-11 | Discharge: 2017-10-11 | Disposition: A | Discharge status: Home / Self Care | Payer: Medicare Other | Source: Ambulatory Visit | Attending: General Surgery | Admitting: General Surgery

## 2017-10-11 DIAGNOSIS — Z01812 Encounter for preprocedural laboratory examination: Secondary | ICD-10-CM | POA: Diagnosis present

## 2017-10-11 DIAGNOSIS — Z01810 Encounter for preprocedural cardiovascular examination: Secondary | ICD-10-CM | POA: Diagnosis present

## 2017-10-11 DIAGNOSIS — R9431 Abnormal electrocardiogram [ECG] [EKG]: Secondary | ICD-10-CM | POA: Diagnosis not present

## 2017-10-11 LAB — BASIC METABOLIC PANEL
ANION GAP: 12 (ref 5–15)
BUN: 17 mg/dL (ref 6–20)
CHLORIDE: 105 mmol/L (ref 101–111)
CO2: 25 mmol/L (ref 22–32)
Calcium: 9 mg/dL (ref 8.9–10.3)
Creatinine, Ser: 0.76 mg/dL (ref 0.44–1.00)
GFR calc non Af Amer: >60 mL/min (ref >60)
Glucose, Bld: 85 mg/dL (ref 65–99)
Potassium: 4.5 mmol/L (ref 3.5–5.1)
SODIUM: 142 mmol/L (ref 135–145)

## 2017-10-11 NOTE — Progress Notes (Signed)
Pt (and daughter, interpreter) arrived at [facility] for labs/EKG. Reviewed EKG with Dr. Conrad [place] who would like for pt's primary MD to review and give clearance prior to surgery. Pt and daughter verbalized understanding and will contact Dr. Jeanie Cooks (her primary at [facility]) to see pt ASAP. Copy of EKG given to dtr and info of what we need/fax number also given. Hibiclens given with instruction to wash with half night before and half morning of surgery and use caution in shower as floor may become slick. Ensure presurgery drink given with instruction to drink all by 0845 DOS.

## 2017-10-13 NOTE — H&P (Signed)
Denise Mcclure Location: [facility] Patient #: [id number] DOB: [birth year] Widowed / Language: Denise Mcclure / Race: Refused to Report/Unreported Female        History of Present Illness        The patient is a 72 year old female who presents with breast cancer. 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 Her PCP is Dr. Jeanie Cooks. [facility] [place]. Dr. Truitt Merle is her oncologist. Her daughter is with her throughout the encounter.      I initially evaluated her because of hyperpigmentation thickening and discoloration of the entire left nipple and areola. No prior breast problems. Mammograms and ultrasounds were negative and skin thickening. I performed a punch biopsy of the left nipple and areolar complex on August 26, 2017. 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 She has seen Dr. Burr Medico. MRI shows that the right breast is normal. All of the regional lymph nodes are normal. There is thickening of the skin of the nipple and areola and localized enhancement under the nipple and areola but this is very localized. She does not have extensive disease.      Past history unremarkable except for hypertension Family history negative for breast or ovarian cancer Social history reveals she is a widow. 3 children. Smokes cigarettes. Denies alcohol.      We had a very long discussion today to make decisions about definitive surgical therapy. We discussed mastectomy and sentinel lymph node biopsy, with or without reconstruction. We discussed central lumpectomy and sentinel lymph node biopsy and postop radiation therapy. They had numerous, numerous questions. They would like to go ahead and tentatively scheduled for left central lumpectomy removing the  nipple and areola and the deeper tissues and left axillary sentinel node biopsy. They want urgent referral to radiation oncology to make sure they understand what is involved with that since she has not seen radiation oncology All of this is reasonable.  Referral to radiation oncology completed    Allergies  No Known Drug Allergies  Allergies Reconciled   Medication History  Zolpidem Tartrate (10MG Tablet, Oral) Active. Lovastatin (10MG Tablet, Oral) Active. Chantix Continuing Month Pak (1MG Tablet, Oral) Active. Mirtazapine (30MG Tablet, Oral) Active. Lisinopril-Hydrochlorothiazide (10-12.5MG Tablet, Oral) Active. Medications Reconciled  Vitals Weight: 165.2 lb Height: 59in Body Surface Area: 1.7 m Body Mass Index: 33.37 kg/m  Temp.: 98.16F  Pulse: 117 (Regular)  BP: 140/80 (Sitting, Left Arm, Standard)    Physical Exam  General Mental Status-Alert. General Appearance-Not in acute distress. Build & Nutrition-Well nourished. Posture-Normal posture. Gait-Normal.  Head and Neck Head-normocephalic, atraumatic with no lesions or palpable masses. Trachea-midline. Thyroid Gland Characteristics - normal size and consistency and no palpable nodules.  Chest and Lung Exam Chest and lung exam reveals -on auscultation, normal breath sounds, no adventitious sounds and normal vocal resonance.  Breast Note: The entire left nipple and areola are thickened, a little bit scaly and hyperpigmented. The wound infection. No palpable mass deep in the breast. No axillary adenopathy. In the right breast lower outer quadrant there was an old sebaceous cyst. No adenopathy.   Cardiovascular Cardiovascular examination reveals -normal heart sounds, regular rate and rhythm with no murmurs and femoral artery auscultation bilaterally reveals normal pulses, no bruits, no thrills.  Abdomen Inspection Inspection of the abdomen reveals - No  Hernias. Palpation/Percussion Palpation and Percussion of the abdomen reveal - Soft, Non Tender, No Rigidity (guarding), No hepatosplenomegaly and No Palpable abdominal masses.  Neurologic Neurologic evaluation reveals -alert and oriented x 3 with no impairment of recent or remote memory, normal attention span and ability to concentrate, normal sensation and normal coordination.  Musculoskeletal Normal Exam - Bilateral-Upper Extremity Strength Normal and Lower Extremity Strength Normal.    Assessment & Plan  NEOPLASM OF NIPPLE OF LEFT FEMALE BREAST (D49.3)    Your breast MRI looks good. The cancer appears to be localized to the nipple and areola and just a short distance behind the areola. There are no abnormal lymph nodes The opposite breast is normal The chance that the cancer has spread elsewhere in her body is extremely low.  The pathology report shows that the tumor may have a microscopic area of invasion, meaning it has the potential to spread to lymph nodes For this reason we need to do a precautionary lymph node biopsy  You have met with Dr. Burr Medico, but you have not seen a radiation oncologist.  We spent a long time today discussing the options of central lumpectomy and sentinel lymph node biopsy and radiation therapy. We have compare that to mastectomy and sentinel lymph node biopsy. We have discussed the advantages and disadvantages of each form of treatment There is no survival advantage to mastectomy. The only advantage is that you would not have to have radiation therapy. My bias is to recommend the lesser surgery which is central lumpectomy, sentinel node biopsy but you must have radiation therapy in this setting to have equivalent long-term results.  After talking about this for a long time we decided to refer you for an urgent consultation with the radiation oncologist and to concurrently go ahead and tentatively schedule left breast central lumpectomy, left  axillary sentinel lymph node biopsy I discussed the indications, techniques, and risks of this surgery in detail You can always change your mind after meeting with the radiation oncologist  HYPERTENSION, ESSENTIAL ([place])    Denise Mcclure. Dalbert Batman, M.D., [facility] General and Minimally invasive Surgery Breast and Colorectal Surgery Office:   [phone] Pager:   [phone]

## 2017-10-15 NOTE — Progress Notes (Signed)
Called pt's daughter to see if pt had been evaluated by her primary MD as we discussed last week. She reports they couldn't get in Friday, office was closed Monday, and pt has been unable to get in touch with office today. Reviewed case and EKG with Dr. Ambrose Pancoast (anesthesia) who would like pt to have cardiology consult prior to surgery. Call room nurse called Dr Darrel Hoover office and notified them of this need. Returned call to daughter to tell her that Dr Darrel Hoover office will be in touch with her to arrange a cardiac consult ASAP. Daughter verbalized understanding.

## 2017-10-15 NOTE — Progress Notes (Signed)
Spoke with Dr Darrel Hoover nurse at [place], stated that patient had not been able to get in to see PCP for clearance. EKG was reviewed by anesthesia Dr Ambrose Pancoast and he states pt will need cardiology clearance before surgery. She states that she will speak to Dr Dalbert Batman and try to get pt an appt ASAP.

## 2017-10-16 ENCOUNTER — Ambulatory Visit (HOSPITAL_BASED_OUTPATIENT_CLINIC_OR_DEPARTMENT_OTHER): Admission: RE | Admit: 2017-10-16 | Payer: Medicare Other | Source: Ambulatory Visit | Admitting: General Surgery

## 2017-10-16 ENCOUNTER — Ambulatory Visit: Payer: Medicare Other | Admitting: Cardiovascular Disease

## 2017-10-16 ENCOUNTER — Other Ambulatory Visit (HOSPITAL_COMMUNITY): Payer: Medicare Other

## 2017-10-16 ENCOUNTER — Telehealth: Payer: Self-pay

## 2017-10-16 HISTORY — DX: Malignant (primary) neoplasm, unspecified: C80.1

## 2017-10-16 HISTORY — DX: Hyperlipidemia, unspecified: E78.5

## 2017-10-16 HISTORY — DX: Nicotine dependence, unspecified, uncomplicated: F17.200

## 2017-10-16 SURGERY — BREAST LUMPECTOMY WITH AXILLARY LYMPH NODE BIOPSY
Anesthesia: General | Site: Breast | Laterality: Left

## 2017-10-16 NOTE — Progress Notes (Deleted)
No chief complaint on file.   History of Present Illness: 72 yo female with history of breast cancer, HTN, HLD, tobacco abuse who is here today as a new consult, referred by Dr. Jeanie Cooks, for evaluation of *** She has been diagnosed with breast cancer in January 2019. There are plans for surgery but this was cancelled due to an abnormal EKG. Her EKG on 10/11/17 showed sinus rhythm with T wave inversions in the lateral and anterolateral leads. She is a smoker, usually smoking 1/4 packs per day. No known cardiac disease. She does have HTN and HLD.  Primary Care Physician: Nolene Ebbs, MD   Past Medical History:  Diagnosis Date  . Cancer ([place]) 08/2017   left breast cancer  . Hyperlipidemia   . Hypertension   . Smoker     Past Surgical History:  Procedure Laterality Date  . NO PAST SURGERIES      Current Outpatient Medications  Medication Sig Dispense Refill  . alendronate (FOSAMAX) 70 MG tablet Take 70 mg by mouth once a week.  2  . ibuprofen (ADVIL,MOTRIN) 200 MG tablet Take 200 mg by mouth every 6 (six) hours as needed (pain).    Marland Kitchen losartan-hydrochlorothiazide (HYZAAR) 50-12.5 MG tablet Take 1 tablet by mouth daily.  1  . lovastatin (MEVACOR) 10 MG tablet Take 10 mg by mouth daily.  3  . Multiple Vitamins-Minerals (MULTIVITAMIN PO) Take 1 tablet by mouth daily.    . Omega-3 Fatty Acids (FISH OIL PO) Take 1 tablet by mouth 2 (two) times daily.    Marland Kitchen zolpidem (AMBIEN) 10 MG tablet Take 1 tablet by mouth at bedtime as needed.  2   No current facility-administered medications for this visit.     No Known Allergies  Social History   Socioeconomic History  . Marital status: Widowed    Spouse name: Not on file  . Number of children: Not on file  . Years of education: Not on file  . Highest education level: Not on file  Social Needs  . Financial resource strain: Not on file  . Food insecurity - worry: Not on file  . Food insecurity - inability: Not on file  .  Transportation needs - medical: Not on file  . Transportation needs - non-medical: Not on file  Occupational History  . Not on file  Tobacco Use  . Smoking status: Current Some Day Smoker    Packs/day: 0.25    Types: Cigarettes  . Smokeless tobacco: Never Used  . Tobacco comment: trying to quit  Substance and Sexual Activity  . Alcohol use: No    Frequency: Never  . Drug use: No  . Sexual activity: No    Birth control/protection: Post-menopausal  Other Topics Concern  . Not on file  Social History Narrative  . Not on file    Family History  Problem Relation Age of Onset  . Diabetes Sister   . Diabetes Brother     Review of Systems:  As stated in the HPI and otherwise negative.   There were no vitals taken for this visit.  Physical Examination: General: Well developed, well nourished, NAD  HEENT: OP clear, mucus membranes moist  SKIN: warm, dry. No rashes. Neuro: No focal deficits  Musculoskeletal: Muscle strength 5/5 all ext  Psychiatric: Mood and affect normal  Neck: No JVD, no carotid bruits, no thyromegaly, no lymphadenopathy.  Lungs:Clear bilaterally, no wheezes, rhonci, crackles Cardiovascular: Regular rate and rhythm. No murmurs, gallops or rubs. Abdomen:Soft. Bowel sounds  present. Non-tender.  Extremities: No lower extremity edema. Pulses are 2 + in the bilateral DP/PT.  EKG:  EKG {ACTION; IS/IS HHI:[id number]} ordered today. The ekg ordered today demonstrates ***  Recent Labs: 10/11/2017: BUN 17; Creatinine, Ser 0.76; Potassium 4.5; Sodium 142   Lipid Panel No results found for: CHOL, TRIG, HDL, CHOLHDL, VLDL, LDLCALC, LDLDIRECT   Wt Readings from Last 3 Encounters:  10/01/17 159 lb (72.1 kg)  09/10/17 159 lb 8 oz (72.3 kg)  03/21/15 152 lb (68.9 kg)     Other studies Reviewed: Additional studies/ records that were reviewed today include: ***. Review of the above records demonstrates: ***   Assessment and Plan:   1.   Current medicines are  reviewed at length with the patient today.  The patient {ACTIONS; HAS/DOES NOT HAVE:19233} concerns regarding medicines.  The following changes have been made:  {PLAN; NO CHANGE:13088:s}  Labs/ tests ordered today include: *** No orders of the defined types were placed in this encounter.    Disposition:   FU with *** in {gen number [id number]} {TIME; UNITS DAY/WEEK/MONTH:19136}   Signed, Lauree Chandler, MD 10/16/2017 5:48 AM    [facility] [address], [place], [place]  [address] Phone: [phone]; Fax: [phone]

## 2017-10-16 NOTE — Telephone Encounter (Signed)
   [facility] Pre-operative Risk Assessment    Request for surgical clearance:  1. What type of surgery is being performed?   2. When is this surgery scheduled?   3. What type of clearance is required (medical clearance vs. Pharmacy clearance to hold med vs. Both)?    4. Are there any medications that need to be held prior to surgery and how long?  5. Practice name and name of physician performing surgery?  6. What is your office phone and fax number?   7. Anesthesia type (None, local, MAC, general) ?    Mendel Ryder 10/16/2017, 10:56 AM  _________________________________________________________________   (provider comments below)  Pt was scheduled for a new patient visit today 10/16/2017 but did not show up.  Management will contact pt for further information.

## 2017-10-17 NOTE — Telephone Encounter (Signed)
Patient didn't show for her new patient appointment yesterday.   I called Dr. Darrel Hoover office to let them know. Sunday Spillers at Dr. Harvest Dark office stated the patient's daughter is supposed to call [facility] and reschedule her mother for an appointment.  Dr. Darrel Hoover office is aware that her sx clearance is pending this appointment be rescheduled and completed.     [facility] Pre-operative Risk Assessment    Request for surgical clearance:  1. What type of surgery is being performed? Neoplasm of nipple of left female breast (D49.3)   2. When is this surgery scheduled? TBD; pending written cardiac clearance   3. What type of clearance is required (medical clearance vs. Pharmacy clearance to hold med vs. Both)? Medical clearance due to abnormal EKG on 10/11/17  4. Are there any medications that need to be held prior to surgery and how long? None indicated on fax request   5. Practice name and name of physician performing surgery? [facility] - Fanny Skates, MD   6. What is your office phone and fax number? Phone: [phone] Fax: [phone]   7. Anesthesia type (None, local, MAC, general) ? general   Crystal D Kuhn 10/17/2017, 12:10 PM  _________________________________________________________________   (provider comments below)

## 2017-10-30 ENCOUNTER — Encounter: Payer: Self-pay | Admitting: Internal Medicine

## 2017-10-31 ENCOUNTER — Ambulatory Visit: Payer: Medicare Other | Admitting: Internal Medicine

## 2017-11-07 ENCOUNTER — Ambulatory Visit (INDEPENDENT_AMBULATORY_CARE_PROVIDER_SITE_OTHER): Payer: Medicare Other | Admitting: Internal Medicine

## 2017-11-07 ENCOUNTER — Encounter: Payer: Self-pay | Admitting: Internal Medicine

## 2017-11-07 VITALS — BP 132/92 | HR 103 | Ht <= 58 in | Wt 161.0 lb

## 2017-11-07 DIAGNOSIS — Z01810 Encounter for preprocedural cardiovascular examination: Secondary | ICD-10-CM

## 2017-11-07 DIAGNOSIS — Z720 Tobacco use: Secondary | ICD-10-CM | POA: Diagnosis not present

## 2017-11-07 DIAGNOSIS — C50912 Malignant neoplasm of unspecified site of left female breast: Secondary | ICD-10-CM | POA: Diagnosis not present

## 2017-11-07 DIAGNOSIS — R9431 Abnormal electrocardiogram [ECG] [EKG]: Secondary | ICD-10-CM | POA: Diagnosis not present

## 2017-11-07 NOTE — Progress Notes (Signed)
New Outpatient Visit Date: 11/07/2017  Referring Provider: Fanny Skates, [facility] [address], [place] [address]  Chief Complaint: Breast cancer  HPI:  Denise Mcclure is a 72 y.o. female who is being seen today for preoperative cardiovascular examination at the request of Dr. Dalbert Batman. She has a history of hypertension, hyperlipidemia, and tobacco use. She was diagnosed with left breast cancer in January. Preop EKG in anticipation of breast surgery was abnormal, prompting referral to cardiology.  [name] presents with her daughter today.  She initially noticed a rash on her left breast in January, with subsequent workup revealing breast cancer.  She has otherwise felt well.  She denies chest pain, shortness of breath, palpitations, lightheadedness, orthopnea, PND, and syncope.  She has never undergone cardiac testing other than EKGs.  She is sedentary and does not exercise on a regular basis.  She does not climb stairs regularly either.  --------------------------------------------------------------------------------------------------  Cardiovascular History & Procedures: Cardiovascular Problems:  Abnormal EKG  Risk Factors:  Hypertension, hyperlipidemia, tobacco use, obesity, and age>65  Cath/PCI:  None  CV Surgery:  None  EP Procedures and Devices:  None  Non-Invasive Evaluation(s):  None  Recent CV Pertinent Labs: Lab Results  Component Value Date   K 4.5 10/11/2017   BUN 17 10/11/2017   CREATININE 0.76 10/11/2017    --------------------------------------------------------------------------------------------------  Past Medical History:  Diagnosis Date  . Cancer ([place]) 08/2017   left breast cancer  . Hyperlipidemia   . Hypertension   . Smoker     Past Surgical History:  Procedure Laterality Date  . NO PAST SURGERIES      Current Meds  Medication Sig  . alendronate (FOSAMAX) 70 MG tablet  Take 70 mg by mouth once a week.  Marland Kitchen ibuprofen (ADVIL,MOTRIN) 200 MG tablet Take 200 mg by mouth every 6 (six) hours as needed (pain).  Marland Kitchen losartan-hydrochlorothiazide (HYZAAR) 50-12.5 MG tablet Take 1 tablet by mouth daily.  Marland Kitchen lovastatin (MEVACOR) 10 MG tablet Take 10 mg by mouth daily.  . Multiple Vitamins-Minerals (MULTIVITAMIN PO) Take 1 tablet by mouth daily.  . Omega-3 Fatty Acids (FISH OIL PO) Take 1 tablet by mouth 2 (two) times daily.  Marland Kitchen zolpidem (AMBIEN) 10 MG tablet Take 1 tablet by mouth at bedtime as needed.    Allergies: Patient has no known allergies.  Social History   Socioeconomic History  . Marital status: Widowed    Spouse name: Not on file  . Number of children: Not on file  . Years of education: Not on file  . Highest education level: Not on file  Social Needs  . Financial resource strain: Not on file  . Food insecurity - worry: Not on file  . Food insecurity - inability: Not on file  . Transportation needs - medical: Not on file  . Transportation needs - non-medical: Not on file  Occupational History  . Not on file  Tobacco Use  . Smoking status: Current Every Day Smoker    Packs/day: 0.50    Years: 30.00    Pack years: 15.00    Types: Cigarettes  . Smokeless tobacco: Never Used  . Tobacco comment: trying to quit  Substance and Sexual Activity  . Alcohol use: No    Frequency: Never  . Drug use: No  . Sexual activity: No    Birth control/protection: Post-menopausal  Other Topics Concern  . Not on file  Social History Narrative  . Not on file  Family History  Problem Relation Age of Onset  . Diabetes Sister   . Diabetes Brother   . Asthma Mother   . Heart disease Cousin     Review of Systems: A 12-system review of systems was performed and was negative except as noted in the HPI.  --------------------------------------------------------------------------------------------------  Physical Exam: BP (!) 132/92   Pulse (!) 103   Ht 4\' 10"   (1.473 m)   Wt 161 lb (73 kg)   BMI 33.65 kg/m   General: Obese woman, seated comfortably in the exam room.  Her daughter accompanies her today. HEENT: No conjunctival pallor or scleral icterus. Moist mucous membranes. OP clear. Neck: Supple without lymphadenopathy, thyromegaly, JVD, or HJR. No carotid bruit. Lungs: Normal work of breathing. Clear to auscultation bilaterally without wheezes or crackles. Heart: Regular without murmurs, rubs, or gallops.  Nondisplaced PMI. Abd: Bowel sounds present. Soft, NT/ND without hepatosplenomegaly Ext: No lower extremity edema. Radial, PT, and DP pulses are 2+ bilaterally Skin: Warm and dry without rash. Neuro: CNIII-XII intact. Strength and fine-touch sensation intact in upper and lower extremities bilaterally. Psych: Normal mood and affect.  EKG: Sinus tachycardia (heart rate 103 bpm) with borderline LVH and anterolateral T wave inversions and nonspecific ST segment changes.  No significant change from prior tracing on 10/11/17.  Lab Results  Component Value Date   HGB 14.6 03/21/2015   HCT 43.0 03/21/2015    Lab Results  Component Value Date   NA 142 10/11/2017   K 4.5 10/11/2017   CL 105 10/11/2017   CO2 25 10/11/2017   BUN 17 10/11/2017   CREATININE 0.76 10/11/2017   GLUCOSE 85 10/11/2017    No results found for: CHOL, HDL, LDLCALC, LDLDIRECT, TRIG, CHOLHDL   --------------------------------------------------------------------------------------------------  ASSESSMENT AND PLAN: Abnormal EKG and preoperative cardiovascular examination for breast cancer Ms. Sarmaindani does not report any symptoms to suggest unstable cardiovascular disease, though her functional capacity is limited.  She rarely performs more than 4 METs of activity.  EKG exhibits borderline LVH with anterolateral T wave inversions and nonspecific ST changes.  These findings could be seen with underlying coronary artery disease as well as hypertrophic cardiomyopathy  (particularly apical variant).  We have agreed to obtain a transthoracic echocardiogram and exercise myocardial perfusion stress tes (may need to be switched to regadenoson if the patient is unable to achieve target heart rate on the treadmill).  If there is no evidence of significant ischemia or LVOT obstruction (unlikely given lack of murmur on exam), I think it would be reasonable to proceed with elective surgery.  We will try to schedule the echo and stress test to be done within the next week.  I do not recommend any medication changes at this time.  Tobacco use I have encouraged Ms. Sarmaindani to quit smoking.  She has previously been intolerant of Chantix but is now using nicotine replacement.  Follow-up: Return to clinic in 1 month.  If echo and stress test are normal, this can be canceled and the patient follow-up on an as-needed basis.  Nelva Bush, MD 11/07/2017 3:04 PM

## 2017-11-07 NOTE — Patient Instructions (Addendum)
Medication Instructions:  Your physician recommends that you continue on your current medications as directed. Please refer to the Current Medication list given to you today.  -- If you need a refill on your cardiac medications before your next appointment, please call your pharmacy. --  Labwork: None ordered  Testing/Procedures:PLEASE schedule to be done within [facility] has requested that you have an echocardiogram. Echocardiography is a painless test that uses sound waves to create images of your heart. It provides your doctor with information about the size and shape of your heart and how well your heart's chambers and valves are working. This procedure takes approximately one hour. There are no restrictions for this procedure.  Your physician has requested that you have en exercise stress myoview. For further information please visit [email or web address]. Please follow instruction sheet, as given.   Follow-Up: Your physician wants you to follow-up in: 1 month with APP     Thank you for choosing [facility]!!    Any Other Special Instructions Will Be Listed Below (If Applicable).

## 2017-11-07 NOTE — Progress Notes (Signed)
Myoview Letter

## 2017-11-08 ENCOUNTER — Encounter: Payer: Self-pay | Admitting: Internal Medicine

## 2017-11-08 DIAGNOSIS — Z01810 Encounter for preprocedural cardiovascular examination: Secondary | ICD-10-CM | POA: Insufficient documentation

## 2017-11-08 DIAGNOSIS — R9431 Abnormal electrocardiogram [ECG] [EKG]: Secondary | ICD-10-CM | POA: Insufficient documentation

## 2017-11-08 DIAGNOSIS — Z720 Tobacco use: Secondary | ICD-10-CM | POA: Insufficient documentation

## 2017-11-12 ENCOUNTER — Telehealth (HOSPITAL_COMMUNITY): Payer: Self-pay | Admitting: *Deleted

## 2017-11-12 NOTE — Telephone Encounter (Signed)
Left message on voicemail per DPR in reference to upcoming appointment scheduled on 11/14/17 at 0715 with detailed instructions given per Myocardial Perfusion Study Information Sheet for the test. LM to arrive 15 minutes early, and that it is imperative to arrive on time for appointment to keep from having the test rescheduled. If you need to cancel or reschedule your appointment, please call the office within 24 hours of your appointment. Failure to do so may result in a cancellation of your appointment, and a $50 no show fee. Phone number given for call back for any questions.

## 2017-11-14 ENCOUNTER — Other Ambulatory Visit: Payer: Self-pay

## 2017-11-14 ENCOUNTER — Ambulatory Visit (HOSPITAL_COMMUNITY): Payer: Medicare Other | Attending: Cardiovascular Disease

## 2017-11-14 ENCOUNTER — Ambulatory Visit (HOSPITAL_BASED_OUTPATIENT_CLINIC_OR_DEPARTMENT_OTHER): Payer: Medicare Other

## 2017-11-14 DIAGNOSIS — E785 Hyperlipidemia, unspecified: Secondary | ICD-10-CM | POA: Diagnosis not present

## 2017-11-14 DIAGNOSIS — Z01810 Encounter for preprocedural cardiovascular examination: Secondary | ICD-10-CM | POA: Insufficient documentation

## 2017-11-14 DIAGNOSIS — Z853 Personal history of malignant neoplasm of breast: Secondary | ICD-10-CM | POA: Diagnosis not present

## 2017-11-14 DIAGNOSIS — I10 Essential (primary) hypertension: Secondary | ICD-10-CM | POA: Diagnosis not present

## 2017-11-14 DIAGNOSIS — R9431 Abnormal electrocardiogram [ECG] [EKG]: Secondary | ICD-10-CM | POA: Insufficient documentation

## 2017-11-14 LAB — MYOCARDIAL PERFUSION IMAGING
CHL CUP NUCLEAR SDS: 0
CHL CUP NUCLEAR SSS: 1
CSEPPHR: 109 {beats}/min
LHR: 0.27
LV dias vol: 61 mL (ref 46–106)
LVSYSVOL: 23 mL
Rest HR: 80 {beats}/min
SRS: 1
TID: 0.84

## 2017-11-14 MED ORDER — REGADENOSON 0.4 MG/5ML IV SOLN
0.4000 mg | Freq: Once | INTRAVENOUS | Status: AC
  Administered 2017-11-14: 0.4 mg via INTRAVENOUS

## 2017-11-14 MED ORDER — PERFLUTREN LIPID MICROSPHERE
1.0000 mL | INTRAVENOUS | Status: AC | PRN
Start: 2017-11-14 — End: 2017-11-14
  Administered 2017-11-14: 2 mL via INTRAVENOUS

## 2017-11-14 MED ORDER — TECHNETIUM TC 99M TETROFOSMIN IV KIT
31.8000 | PACK | Freq: Once | INTRAVENOUS | Status: AC | PRN
  Administered 2017-11-14: 31.8 via INTRAVENOUS
  Filled 2017-11-14: qty 32

## 2017-11-14 MED ORDER — TECHNETIUM TC 99M TETROFOSMIN IV KIT
10.6000 | PACK | Freq: Once | INTRAVENOUS | Status: AC | PRN
  Administered 2017-11-14: 10.6 via INTRAVENOUS
  Filled 2017-11-14: qty 11

## 2017-11-26 ENCOUNTER — Encounter (HOSPITAL_BASED_OUTPATIENT_CLINIC_OR_DEPARTMENT_OTHER): Payer: Self-pay | Admitting: *Deleted

## 2017-12-01 NOTE — H&P (Signed)
Denise Mcclure Location: [facility] Patient #: [id number] DOB: [birth year] Widowed / Language: [name] / Race: Refused to Report/Unreported Female       History of Present Illness       . 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 Her PCP is Dr. Jeanie Cooks. [facility] [place]. Dr. Truitt Merle is her oncologist. Her daughter is with her throughout the encounter.      I initially evaluated her because of hyperpigmentation thickening and discoloration of the entire left nipple and areola. No prior breast problems. Mammograms and ultrasounds were negative and skin thickening. I performed a punch biopsy of the left nipple and areolar complex on August 26, 2017. 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 She has seen Dr. Burr Medico. MRI shows that the right breast is normal. All of the regional lymph nodes are normal. There is thickening of the skin of the nipple and areola and localized enhancement under the nipple and areola but this is very localized. She does not have extensive disease.     Past history unremarkable except for hypertension Family history negative for breast or ovarian cancer Social history reveals she is a widow. 3 children. Smokes cigarettes. Denies alcohol.      We had a very long discussion today to make decisions about definitive surgical therapy. We discussed mastectomy and sentinel lymph node biopsy, with or without reconstruction. We discussed central lumpectomy and sentinel lymph node biopsy and postop radiation therapy. They had numerous, numerous questions. They would like to go ahead and tentatively scheduled for left central lumpectomy removing the nipple and areola and the deeper tissues and left axillary sentinel node  biopsy. They want urgent referral to radiation oncology to make sure they understand what is involved with that since she has not seen radiation oncology All of this is reasonable.    Allergies  No Known Drug Allergies  Allergies Reconciled   Medication History  Zolpidem Tartrate (10MG Tablet, Oral) Active. Lovastatin (10MG Tablet, Oral) Active. Chantix Continuing Month Pak (1MG Tablet, Oral) Active. Mirtazapine (30MG Tablet, Oral) Active. Lisinopril-Hydrochlorothiazide (10-12.5MG Tablet, Oral) Active. Medications Reconciled  Vitals Weight: 165.2 lb Height: 59in Body Surface Area: 1.7 m Body Mass Index: 33.37 kg/m  Temp.: 98.57F  Pulse: 117 (Regular)  BP: 140/80 (Sitting, Left Arm, Standard)     Physical Exam General Mental Status-Alert. General Appearance-Not in acute distress. Build & Nutrition-Well nourished. Posture-Normal posture. Gait-Normal.  Head and Neck Head-normocephalic, atraumatic with no lesions or palpable masses. Trachea-midline. Thyroid Gland Characteristics - normal size and consistency and no palpable nodules.  Chest and Lung Exam Chest and lung exam reveals -on auscultation, normal breath sounds, no adventitious sounds and normal vocal resonance.  Breast Note: The entire left nipple and areola are thickened, a little bit scaly and hyperpigmented. The wound infection. No palpable mass deep in the breast. No axillary adenopathy. In the right breast lower outer quadrant there was an old sebaceous cyst. No adenopathy.   Cardiovascular Cardiovascular examination reveals -normal heart sounds, regular rate and rhythm with no murmurs and femoral artery auscultation bilaterally reveals normal pulses, no bruits, no thrills.  Abdomen Inspection Inspection of the abdomen reveals - No Hernias. Palpation/Percussion Palpation and Percussion of the abdomen reveal - Soft, Non Tender, No Rigidity (guarding), No  hepatosplenomegaly and  No Palpable abdominal masses.  Neurologic Neurologic evaluation reveals -alert and oriented x 3 with no impairment of recent or remote memory, normal attention span and ability to concentrate, normal sensation and normal coordination.  Musculoskeletal Normal Exam - Bilateral-Upper Extremity Strength Normal and Lower Extremity Strength Normal.    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 The cancer appears to be localized to the nipple and areola and just a short distance behind the areola. There are no abnormal lymph nodes The opposite breast is normal The chance that the cancer has spread elsewhere in her body is extremely low.  The pathology report shows that the tumor may have a microscopic area of invasion, meaning it has the potential to spread to lymph nodes For this reason we need to do a precautionary lymph node biopsy  You have met with Dr. Burr Medico, but you have not seen a radiation oncologist.  We spent a long time today discussing the options of central lumpectomy and sentinel lymph node biopsy and radiation therapy. We have compare that to mastectomy and sentinel lymph node biopsy. We have discussed the advantages and disadvantages of each form of treatment There is no survival advantage to mastectomy. The only advantage is that you would not have to have radiation therapy. My bias is to recommend the lesser surgery which is central lumpectomy, sentinel node biopsy but you must have radiation therapy in this setting to have equivalent long-term results.  After talking about this for a long time we decided to refer you for an urgent consultation with the radiation oncologist and to concurrently go ahead and tentatively schedule left breast central lumpectomy, left axillary sentinel lymph node biopsy I discussed the indications, techniques, and risks of this surgery in detail You can always  change your mind after meeting with the radiation oncologist  HYPERTENSION, ESSENTIAL ([place])    [name]. Dalbert Batman, M.D., [facility] General and Minimally invasive Surgery Breast and Colorectal Surgery Office:   [phone] Pager:   [phone]

## 2017-12-03 ENCOUNTER — Encounter (HOSPITAL_BASED_OUTPATIENT_CLINIC_OR_DEPARTMENT_OTHER): Payer: Self-pay

## 2017-12-03 ENCOUNTER — Ambulatory Visit (HOSPITAL_BASED_OUTPATIENT_CLINIC_OR_DEPARTMENT_OTHER)
Admission: RE | Admit: 2017-12-03 | Discharge: 2017-12-03 | Disposition: A | Discharge status: Home / Self Care | Payer: Medicare Other | Source: Ambulatory Visit | Attending: General Surgery | Admitting: General Surgery

## 2017-12-03 ENCOUNTER — Ambulatory Visit (HOSPITAL_BASED_OUTPATIENT_CLINIC_OR_DEPARTMENT_OTHER): Payer: Medicare Other | Admitting: Anesthesiology

## 2017-12-03 ENCOUNTER — Other Ambulatory Visit: Payer: Self-pay

## 2017-12-03 ENCOUNTER — Ambulatory Visit (HOSPITAL_COMMUNITY)
Admission: RE | Admit: 2017-12-03 | Discharge: 2017-12-03 | Disposition: A | Discharge status: Home / Self Care | Payer: Medicare Other | Source: Ambulatory Visit | Attending: General Surgery | Admitting: General Surgery

## 2017-12-03 ENCOUNTER — Encounter (HOSPITAL_BASED_OUTPATIENT_CLINIC_OR_DEPARTMENT_OTHER)
Admission: RE | Disposition: A | Discharge status: Home / Self Care | Payer: Self-pay | Source: Ambulatory Visit | Attending: General Surgery

## 2017-12-03 DIAGNOSIS — C50019 Malignant neoplasm of nipple and areola, unspecified female breast: Secondary | ICD-10-CM | POA: Diagnosis present

## 2017-12-03 DIAGNOSIS — Z171 Estrogen receptor negative status [ER-]: Secondary | ICD-10-CM | POA: Diagnosis not present

## 2017-12-03 DIAGNOSIS — F17210 Nicotine dependence, cigarettes, uncomplicated: Secondary | ICD-10-CM | POA: Diagnosis not present

## 2017-12-03 DIAGNOSIS — Z79899 Other long term (current) drug therapy: Secondary | ICD-10-CM | POA: Diagnosis not present

## 2017-12-03 DIAGNOSIS — C50012 Malignant neoplasm of nipple and areola, left female breast: Secondary | ICD-10-CM | POA: Insufficient documentation

## 2017-12-03 DIAGNOSIS — I10 Essential (primary) hypertension: Secondary | ICD-10-CM | POA: Diagnosis not present

## 2017-12-03 DIAGNOSIS — D0512 Intraductal carcinoma in situ of left breast: Secondary | ICD-10-CM | POA: Diagnosis present

## 2017-12-03 DIAGNOSIS — C50112 Malignant neoplasm of central portion of left female breast: Secondary | ICD-10-CM

## 2017-12-03 HISTORY — PX: BREAST LUMPECTOMY WITH SENTINEL LYMPH NODE BIOPSY: SHX5597

## 2017-12-03 LAB — POCT I-STAT, CHEM 8
BUN: 16 mg/dL (ref 6–20)
CREATININE: 0.7 mg/dL (ref 0.44–1.00)
Calcium, Ion: 0.95 mmol/L — ABNORMAL LOW (ref 1.15–1.40)
Chloride: 105 mmol/L (ref 101–111)
Glucose, Bld: 85 mg/dL (ref 65–99)
HCT: 42 % (ref 36.0–46.0)
HEMOGLOBIN: 14.3 g/dL (ref 12.0–15.0)
POTASSIUM: 3.4 mmol/L — AB (ref 3.5–5.1)
SODIUM: 139 mmol/L (ref 135–145)
TCO2: 27 mmol/L (ref 22–32)

## 2017-12-03 SURGERY — BREAST LUMPECTOMY WITH SENTINEL LYMPH NODE BX
Anesthesia: General | Site: Breast | Laterality: Left

## 2017-12-03 MED ORDER — ACETAMINOPHEN 500 MG PO TABS
1000.0000 mg | ORAL_TABLET | ORAL | Status: AC
  Administered 2017-12-03: 1000 mg via ORAL

## 2017-12-03 MED ORDER — METHYLENE BLUE 0.5 % INJ SOLN
INTRAVENOUS | Status: DC | PRN
  Administered 2017-12-03: 5 mL via INTRADERMAL

## 2017-12-03 MED ORDER — GABAPENTIN 300 MG PO CAPS
ORAL_CAPSULE | ORAL | Status: AC
  Filled 2017-12-03: qty 1

## 2017-12-03 MED ORDER — MIDAZOLAM HCL 2 MG/2ML IJ SOLN
INTRAMUSCULAR | Status: AC
  Filled 2017-12-03: qty 2

## 2017-12-03 MED ORDER — TECHNETIUM TC 99M SULFUR COLLOID FILTERED
1.0000 | Freq: Once | INTRAVENOUS | Status: AC | PRN
  Administered 2017-12-03: 1 via INTRADERMAL

## 2017-12-03 MED ORDER — 0.9 % SODIUM CHLORIDE (POUR BTL) OPTIME
TOPICAL | Status: DC | PRN
  Administered 2017-12-03: 1000 mL

## 2017-12-03 MED ORDER — ONDANSETRON HCL 4 MG/2ML IJ SOLN
INTRAMUSCULAR | Status: AC
  Filled 2017-12-03: qty 2

## 2017-12-03 MED ORDER — HYDROMORPHONE HCL 1 MG/ML IJ SOLN: 0.2500 mg | INTRAMUSCULAR | Status: DC | PRN

## 2017-12-03 MED ORDER — CEFAZOLIN SODIUM-DEXTROSE 2-4 GM/100ML-% IV SOLN
2.0000 g | INTRAVENOUS | Status: AC
  Administered 2017-12-03: 2 g via INTRAVENOUS

## 2017-12-03 MED ORDER — OXYCODONE HCL 5 MG PO TABS: 5.0000 mg | ORAL_TABLET | Freq: Once | ORAL | Status: DC | PRN

## 2017-12-03 MED ORDER — OXYCODONE HCL 5 MG/5ML PO SOLN: 5.0000 mg | Freq: Once | ORAL | Status: DC | PRN

## 2017-12-03 MED ORDER — MEPERIDINE HCL 25 MG/ML IJ SOLN: 6.2500 mg | INTRAMUSCULAR | Status: DC | PRN

## 2017-12-03 MED ORDER — ROPIVACAINE HCL 5 MG/ML IJ SOLN
INTRAMUSCULAR | Status: DC | PRN
  Administered 2017-12-03: 30 mL via PERINEURAL

## 2017-12-03 MED ORDER — PROPOFOL 10 MG/ML IV BOLUS
INTRAVENOUS | Status: AC
  Filled 2017-12-03: qty 20

## 2017-12-03 MED ORDER — FENTANYL CITRATE (PF) 100 MCG/2ML IJ SOLN: 25.0000 ug | INTRAMUSCULAR | Status: DC | PRN

## 2017-12-03 MED ORDER — OXYCODONE HCL 5 MG PO TABS: 5.0000 mg | ORAL_TABLET | ORAL | Status: DC | PRN

## 2017-12-03 MED ORDER — GABAPENTIN 300 MG PO CAPS
300.0000 mg | ORAL_CAPSULE | ORAL | Status: AC
  Administered 2017-12-03: 300 mg via ORAL

## 2017-12-03 MED ORDER — LIDOCAINE HCL (CARDIAC) 20 MG/ML IV SOLN
INTRAVENOUS | Status: AC
  Filled 2017-12-03: qty 5

## 2017-12-03 MED ORDER — PROMETHAZINE HCL 25 MG/ML IJ SOLN: 6.2500 mg | INTRAMUSCULAR | Status: DC | PRN

## 2017-12-03 MED ORDER — DEXAMETHASONE SODIUM PHOSPHATE 4 MG/ML IJ SOLN
INTRAMUSCULAR | Status: DC | PRN
  Administered 2017-12-03: 10 mg via INTRAVENOUS

## 2017-12-03 MED ORDER — PROPOFOL 10 MG/ML IV BOLUS
INTRAVENOUS | Status: DC | PRN
  Administered 2017-12-03: 150 mg via INTRAVENOUS
  Administered 2017-12-03: 50 mg via INTRAVENOUS

## 2017-12-03 MED ORDER — FENTANYL CITRATE (PF) 100 MCG/2ML IJ SOLN
INTRAMUSCULAR | Status: AC
  Filled 2017-12-03: qty 2

## 2017-12-03 MED ORDER — SODIUM CHLORIDE 0.9% FLUSH: 3.0000 mL | INTRAVENOUS | Status: DC | PRN

## 2017-12-03 MED ORDER — HYDROCODONE-ACETAMINOPHEN 5-325 MG PO TABS: 1.0000 | ORAL_TABLET | Freq: Four times a day (QID) | ORAL | 0 refills | Status: DC | PRN

## 2017-12-03 MED ORDER — MIDAZOLAM HCL 2 MG/2ML IJ SOLN
1.0000 mg | INTRAMUSCULAR | Status: DC | PRN
  Administered 2017-12-03: 1 mg via INTRAVENOUS

## 2017-12-03 MED ORDER — SODIUM CHLORIDE 0.9% FLUSH: 3.0000 mL | Freq: Two times a day (BID) | INTRAVENOUS | Status: DC

## 2017-12-03 MED ORDER — LACTATED RINGERS IV SOLN: INTRAVENOUS | Status: DC

## 2017-12-03 MED ORDER — DEXAMETHASONE SODIUM PHOSPHATE 10 MG/ML IJ SOLN
INTRAMUSCULAR | Status: AC
  Filled 2017-12-03: qty 1

## 2017-12-03 MED ORDER — BUPIVACAINE-EPINEPHRINE 0.5% -1:200000 IJ SOLN
INTRAMUSCULAR | Status: DC | PRN
  Administered 2017-12-03: 12 mL

## 2017-12-03 MED ORDER — CHLORHEXIDINE GLUCONATE CLOTH 2 % EX PADS: 6.0000 | MEDICATED_PAD | Freq: Once | CUTANEOUS | Status: DC

## 2017-12-03 MED ORDER — ACETAMINOPHEN 325 MG PO TABS: 650.0000 mg | ORAL_TABLET | ORAL | Status: DC | PRN

## 2017-12-03 MED ORDER — FENTANYL CITRATE (PF) 100 MCG/2ML IJ SOLN
50.0000 ug | INTRAMUSCULAR | Status: AC | PRN
  Administered 2017-12-03 (×3): 50 ug via INTRAVENOUS

## 2017-12-03 MED ORDER — ACETAMINOPHEN 650 MG RE SUPP: 650.0000 mg | RECTAL | Status: DC | PRN

## 2017-12-03 MED ORDER — LIDOCAINE HCL (CARDIAC) 20 MG/ML IV SOLN
INTRAVENOUS | Status: DC | PRN
  Administered 2017-12-03: 100 mg via INTRAVENOUS

## 2017-12-03 MED ORDER — SCOPOLAMINE 1 MG/3DAYS TD PT72: 1.0000 | MEDICATED_PATCH | Freq: Once | TRANSDERMAL | Status: DC | PRN

## 2017-12-03 MED ORDER — ACETAMINOPHEN 500 MG PO TABS
ORAL_TABLET | ORAL | Status: AC
  Filled 2017-12-03: qty 2

## 2017-12-03 MED ORDER — LACTATED RINGERS IV SOLN
INTRAVENOUS | Status: DC
  Administered 2017-12-03: 10:00:00 via INTRAVENOUS

## 2017-12-03 MED ORDER — CEFAZOLIN SODIUM-DEXTROSE 2-4 GM/100ML-% IV SOLN
INTRAVENOUS | Status: AC
  Filled 2017-12-03: qty 100

## 2017-12-03 MED ORDER — SODIUM CHLORIDE 0.9 % IV SOLN: 250.0000 mL | INTRAVENOUS | Status: DC | PRN

## 2017-12-03 SURGICAL SUPPLY — 74 items
APPLIER CLIP 11 MED OPEN (CLIP) ×3
APPLIER CLIP 9.375 MED OPEN (MISCELLANEOUS)
BANDAGE ACE 6X5 VEL STRL LF (GAUZE/BANDAGES/DRESSINGS) IMPLANT
BENZOIN TINCTURE PRP APPL 2/3 (GAUZE/BANDAGES/DRESSINGS) IMPLANT
BLADE HEX COATED 2.75 (ELECTRODE) ×3 IMPLANT
BLADE SURG 10 STRL SS (BLADE) IMPLANT
BLADE SURG 15 STRL LF DISP TIS (BLADE) ×1 IMPLANT
BLADE SURG 15 STRL SS (BLADE) ×2
CANISTER SUCT 1200ML W/VALVE (MISCELLANEOUS) ×3 IMPLANT
CHLORAPREP W/TINT 26ML (MISCELLANEOUS) ×3 IMPLANT
CLIP APPLIE 11 MED OPEN (CLIP) ×1 IMPLANT
CLIP APPLIE 9.375 MED OPEN (MISCELLANEOUS) IMPLANT
CLIP VESOCCLUDE MED 6/CT (CLIP) IMPLANT
CLOSURE WOUND 1/2 X4 (GAUZE/BANDAGES/DRESSINGS)
COVER BACK TABLE 60X90IN (DRAPES) ×3 IMPLANT
COVER MAYO STAND STRL (DRAPES) ×3 IMPLANT
COVER PROBE W GEL 5X96 (DRAPES) ×3 IMPLANT
DECANTER SPIKE VIAL GLASS SM (MISCELLANEOUS) IMPLANT
DERMABOND ADVANCED (GAUZE/BANDAGES/DRESSINGS)
DERMABOND ADVANCED .7 DNX12 (GAUZE/BANDAGES/DRESSINGS) IMPLANT
DEVICE DUBIN W/COMP PLATE 8390 (MISCELLANEOUS) IMPLANT
DRAIN CHANNEL 19F RND (DRAIN) IMPLANT
DRAIN HEMOVAC 1/8 X 5 (WOUND CARE) IMPLANT
DRAPE LAPAROSCOPIC ABDOMINAL (DRAPES) ×3 IMPLANT
DRAPE UTILITY XL STRL (DRAPES) ×3 IMPLANT
DRSG PAD ABDOMINAL 8X10 ST (GAUZE/BANDAGES/DRESSINGS) ×3 IMPLANT
ELECT REM PT RETURN 9FT ADLT (ELECTROSURGICAL) ×3
ELECTRODE REM PT RTRN 9FT ADLT (ELECTROSURGICAL) ×1 IMPLANT
EVACUATOR SILICONE 100CC (DRAIN) IMPLANT
GAUZE SPONGE 4X4 12PLY STRL (GAUZE/BANDAGES/DRESSINGS) ×3 IMPLANT
GAUZE SPONGE 4X4 12PLY STRL LF (GAUZE/BANDAGES/DRESSINGS) IMPLANT
GAUZE SPONGE 4X4 16PLY XRAY LF (GAUZE/BANDAGES/DRESSINGS) IMPLANT
GLOVE BIOGEL PI IND STRL 7.0 (GLOVE) ×1 IMPLANT
GLOVE BIOGEL PI IND STRL 7.5 (GLOVE) ×1 IMPLANT
GLOVE BIOGEL PI INDICATOR 7.0 (GLOVE) ×2
GLOVE BIOGEL PI INDICATOR 7.5 (GLOVE) ×2
GLOVE EUDERMIC 7 POWDERFREE (GLOVE) ×3 IMPLANT
GLOVE SURG SYN 7.5  E (GLOVE) ×2
GLOVE SURG SYN 7.5 E (GLOVE) ×1 IMPLANT
GOWN STRL REUS W/ TWL LRG LVL3 (GOWN DISPOSABLE) ×1 IMPLANT
GOWN STRL REUS W/ TWL XL LVL3 (GOWN DISPOSABLE) ×2 IMPLANT
GOWN STRL REUS W/TWL LRG LVL3 (GOWN DISPOSABLE) ×2
GOWN STRL REUS W/TWL XL LVL3 (GOWN DISPOSABLE) ×4
ILLUMINATOR WAVEGUIDE N/F (MISCELLANEOUS) IMPLANT
KIT MARKER MARGIN INK (KITS) ×3 IMPLANT
LIGHT WAVEGUIDE WIDE FLAT (MISCELLANEOUS) IMPLANT
NDL SAFETY ECLIPSE 18X1.5 (NEEDLE) IMPLANT
NEEDLE HYPO 18GX1.5 SHARP (NEEDLE)
NEEDLE HYPO 25X1 1.5 SAFETY (NEEDLE) ×6 IMPLANT
NS IRRIG 1000ML POUR BTL (IV SOLUTION) ×3 IMPLANT
PACK BASIN DAY SURGERY FS (CUSTOM PROCEDURE TRAY) ×3 IMPLANT
PAD ALCOHOL SWAB (MISCELLANEOUS) ×6 IMPLANT
PENCIL BUTTON HOLSTER BLD 10FT (ELECTRODE) ×3 IMPLANT
PIN SAFETY STERILE (MISCELLANEOUS) IMPLANT
SHEET MEDIUM DRAPE 40X70 STRL (DRAPES) ×3 IMPLANT
SLEEVE SCD COMPRESS KNEE MED (MISCELLANEOUS) ×3 IMPLANT
SPONGE LAP 18X18 RF (DISPOSABLE) IMPLANT
SPONGE LAP 4X18 X RAY DECT (DISPOSABLE) ×6 IMPLANT
STAPLER VISISTAT 35W (STAPLE) IMPLANT
STRIP CLOSURE SKIN 1/2X4 (GAUZE/BANDAGES/DRESSINGS) IMPLANT
SUT ETHILON 3 0 FSL (SUTURE) IMPLANT
SUT MNCRL AB 4-0 PS2 18 (SUTURE) ×6 IMPLANT
SUT SILK 2 0 SH (SUTURE) ×3 IMPLANT
SUT VIC AB 2-0 CT1 27 (SUTURE) ×4
SUT VIC AB 2-0 CT1 TAPERPNT 27 (SUTURE) ×2 IMPLANT
SUT VIC AB 3-0 SH 27 (SUTURE)
SUT VIC AB 3-0 SH 27X BRD (SUTURE) IMPLANT
SUT VICRYL 3-0 CR8 SH (SUTURE) ×3 IMPLANT
SYR 10ML LL (SYRINGE) ×6 IMPLANT
TOWEL OR 17X24 6PK STRL BLUE (TOWEL DISPOSABLE) ×6 IMPLANT
TOWEL OR NON WOVEN STRL DISP B (DISPOSABLE) IMPLANT
TUBE CONNECTING 20'X1/4 (TUBING) ×1
TUBE CONNECTING 20X1/4 (TUBING) ×2 IMPLANT
YANKAUER SUCT BULB TIP NO VENT (SUCTIONS) ×3 IMPLANT

## 2017-12-03 NOTE — Anesthesia Procedure Notes (Signed)
Procedure Name: LMA Insertion Date/Time: 12/03/2017 10:04 AM Performed by: Maryella Shivers, CRNA Pre-anesthesia Checklist: Patient identified, Emergency Drugs available, Suction available and Patient being monitored Patient Re-evaluated:Patient Re-evaluated prior to induction Oxygen Delivery Method: Circle system utilized Preoxygenation: Pre-oxygenation with 100% oxygen Induction Type: IV induction Ventilation: Mask ventilation without difficulty LMA: LMA inserted LMA Size: 4.0 Number of attempts: 1 Airway Equipment and Method: Bite block Placement Confirmation: positive ETCO2 Tube secured with: Tape Dental Injury: Teeth and Oropharynx as per pre-operative assessment

## 2017-12-03 NOTE — Anesthesia Preprocedure Evaluation (Signed)
Anesthesia Evaluation  Patient identified by MRN, date of birth, ID band Patient awake    Reviewed: Allergy & Precautions, NPO status , Patient's Chart, lab work & pertinent test results  Airway Mallampati: II  TM Distance: >3 FB Neck ROM: Full    Dental no notable dental hx.    Pulmonary neg pulmonary ROS, Current Smoker,    Pulmonary exam normal breath sounds clear to auscultation       Cardiovascular hypertension, Pt. on medications negative cardio ROS Normal cardiovascular exam Rhythm:Regular Rate:Normal     Neuro/Psych negative neurological ROS  negative psych ROS   GI/Hepatic negative GI ROS, Neg liver ROS,   Endo/Other  negative endocrine ROS  Renal/GU negative Renal ROS  negative genitourinary   Musculoskeletal negative musculoskeletal ROS (+)   Abdominal (+) + obese,   Peds negative pediatric ROS (+)  Hematology negative hematology ROS (+)   Anesthesia Other Findings Breast Cancer  Reproductive/Obstetrics negative OB ROS                             Anesthesia Physical Anesthesia Plan  ASA: III  Anesthesia Plan: General   Post-op Pain Management: GA combined w/ Regional for post-op pain   Induction: Intravenous  PONV Risk Score and Plan: 2 and Ondansetron and Midazolam  Airway Management Planned: Oral ETT and LMA  Additional Equipment:   Intra-op Plan:   Post-operative Plan: Extubation in OR  Informed Consent: I have reviewed the patients History and Physical, chart, labs and discussed the procedure including the risks, benefits and alternatives for the proposed anesthesia with the patient or authorized representative who has indicated his/her understanding and acceptance.   Dental advisory given  Plan Discussed with: CRNA  Anesthesia Plan Comments:         Anesthesia Quick Evaluation

## 2017-12-03 NOTE — Op Note (Signed)
Patient Name:           Denise Mcclure   Date of Surgery:        12/03/2017  Pre op Diagnosis:      Malignant neoplasm of nipple and areola, left female breast  Post op Diagnosis:    Same  Procedure:                Inject blue dye left breast                                    Left central lumpectomy                                      Left axillary deep sentinel lymph node biopsy  Surgeon:                     [name]. 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 Her PCP is Dr. Jeanie Cooks. [facility] [place]. Dr. Truitt Merle is her oncologist.       I initially evaluated her because of hyperpigmentation thickening and discoloration of the entire left nipple and areola. No prior breast problems. Mammograms and ultrasounds were negative and skin thickening. I performed a punch biopsy of the left nipple and areolar complex on August 26, 2017. 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 She has seen Dr. Burr Medico. MRI shows that the right breast is normal. All of the regional lymph nodes are normal. There is thickening of the skin of the nipple and areola and localized enhancement under the nipple and areola but this is very localized. She does not have extensive disease. Family history negative for breast or ovarian cancer      We had a very long discussion today to make decisions about definitive surgical therapy. It has taken her a long time to make decisions about each step in her care.  She and the family have had lots of anxiety about testing, referrals to other doctors, and treatment recommendations that I have made.  We discussed mastectomy and sentinel lymph node biopsy, with  or without reconstruction. We discussed central lumpectomy and sentinel lymph node biopsy and postop radiation therapy. They had numerous, numerous questions. They would like to go ahead and tentatively scheduled for left central lumpectomy removing the nipple and areola and the deeper tissues and left axillary sentinel node biopsy    Operative Findings:       The patient's entire areola and nipple appeared to be extremely hyperpigmented and replaced by a neoplastic process.  There was no significant mass in the breast.  There is no palpable adenopathy.  Procedure in Detail:          The patient underwent pectoral block and injection of technetium radionuclide in the holding area.  She was brought to the operating room and underwent general anesthesia with LMA device.  Surgical timeout was performed.  Intravenous antibiotics were given.  Following an alcohol prep I injected 5 cc of dilute methylene blue into the left breast subareolar area and massaged the breast for a few minutes.       I made a generous transverse elliptical incision.  This was at least 20 cm long and at least 8 cm wide.  My goal was to get more than 1 cm of normal skin outside the areola.  This was accomplished.  Dissection was carried down into the breast tissue and I dissected all the breast tissue underneath the skin for about 3 cm depth.  The specimen was marked with silk sutures and a 6 color ink kit to orient the pathologist.  Special request was made to check the skin margins.  This wound was irrigated.  Hemostasis was excellent.  I placed 5 metal marker clips into the walls of the lumpectomy cavity.  The deeper breast tissues were closed with 2-0 Vicryl sutures and the skin closed with a running subcuticular 4-0 Monocryl and Dermabond.      I made a transverse incision in a skin crease  of the left axilla at the hairline.  Dissection was carried down through the subcutaneous tissue.  The clavipectoral fascia was incised.  Using  the neoprobe I found what I thought were 4 sentinel lymph nodes which were sent as separate specimens.  There was no more radioactivity or blue dye in the axilla.  Hemostasis was excellent.  The wound was irrigated.  The clavipectoral fascia was closed with 3-0 Vicryl sutures and the skin closed with a running subcuticular 4-0 Monocryl and Dermabond.  Dry bandages and a breast binder were placed.  Patient tolerated the procedure well and was taken to PACU in stable condition.  EBL 25 cc or less.  Counts correct.  Complications none.    Addendum: I logged on to the [facility] website and reviewed her prescription medication record     Collan Schoenfeld M. Dalbert Batman, M.D., FACS General and Minimally Invasive Surgery Breast and Colorectal Surgery  12/03/2017 11:51 AM

## 2017-12-03 NOTE — Progress Notes (Signed)
Assisted Dr. Sabra Heck with left, ultrasound guided, pectoralis block. And nuc med tech with nuc med inj Side rails up, monitors on throughout procedure. See vital signs in flow sheet. Tolerated Procedure well.

## 2017-12-03 NOTE — Interval H&P Note (Signed)
History and Physical Interval Note:  12/03/2017 8:46 AM  Denise Mcclure  has presented today for surgery, with the diagnosis of LEFT BREAST CANCER  The various methods of treatment have been discussed with the patient and family. After consideration of risks, benefits and other options for treatment, the patient has consented to  Procedure(s): LEFT BREAST CENTRAL LUMPECTOMY WITH SENTINEL LYMPH NODE BX, INJECT BLUE DYE LEFT BREAST (Left) as a surgical intervention .  The patient's history has been reviewed, patient examined, no change in status, stable for surgery.  I have reviewed the patient's chart and labs.  Questions were answered to the patient's satisfaction.     Adin Hector

## 2017-12-03 NOTE — Discharge Instructions (Signed)
[place] Office Phone Number [phone]  BREAST BIOPSY/ LUMPECTOMY: POST OP INSTRUCTIONS  Always review your discharge instruction sheet given to you by the facility where your surgery was performed.  IF YOU HAVE DISABILITY OR FAMILY LEAVE FORMS, YOU MUST BRING THEM TO THE OFFICE FOR PROCESSING.  DO NOT GIVE THEM TO YOUR DOCTOR.  1. A prescription for pain medication may be given to you upon discharge.  Take your pain medication as prescribed, if needed.  If narcotic pain medicine is not needed, then you may take acetaminophen (Tylenol) or ibuprofen (Advil) as needed. *1,000mg  Tylenol given at 9:37am* 2. Take your usually prescribed medications unless otherwise directed 3. If you need a refill on your pain medication, please contact your pharmacy.  They will contact our office to request authorization.  Prescriptions will not be filled after 5pm or on week-ends. 4. You should eat very light the first 24 hours after surgery, such as soup, crackers, pudding, etc.  Resume your normal diet the day after surgery. 5. Most patients will experience some swelling and bruising in the breast.  Ice packs and a good support bra will help.  Swelling and bruising can take several days to resolve.  6. It is common to experience some constipation if taking pain medication after surgery.  Increasing fluid intake and taking a stool softener will usually help or prevent this problem from occurring.  A mild laxative (Milk of Magnesia or Miralax) should be taken according to package directions if there are no bowel movements after 48 hours. 7. Unless discharge instructions indicate otherwise, you may remove your bandages 24-48 hours after surgery, and you may shower at that time.  You may have steri-strips (small skin tapes) in place directly over the incision.  These strips should be left on the skin for 7-10 days.  If your surgeon used skin glue on the incision, you may shower in 24 hours.  The glue  will flake off over the next 2-3 weeks.  Any sutures or staples will be removed at the office during your follow-up visit. 8. ACTIVITIES:  You may resume regular daily activities (gradually increasing) beginning the next day.  Wearing a good support bra or sports bra minimizes pain and swelling.  You may have sexual intercourse when it is comfortable. a. You may drive when you no longer are taking prescription pain medication, you can comfortably wear a seatbelt, and you can safely maneuver your car and apply brakes. b. RETURN TO WORK:  ______________________________________________________________________________________ 9. You should see your doctor in the office for a follow-up appointment approximately two weeks after your surgery.  Your doctors nurse will typically make your follow-up appointment when she calls you with your pathology report.  Expect your pathology report 2-3 business days after your surgery.  You may call to check if you do not hear from [place] after three days. 10. OTHER INSTRUCTIONS: _______________________________________________________________________________________________ _____________________________________________________________________________________________________________________________________ _____________________________________________________________________________________________________________________________________ _____________________________________________________________________________________________________________________________________  WHEN TO CALL YOUR DOCTOR: 1. Fever over 101.0 2. Nausea and/or vomiting. 3. Extreme swelling or bruising. 4. Continued bleeding from incision. 5. Increased pain, redness, or drainage from the incision.  The clinic staff is available to answer your questions during regular business hours.  Please dont hesitate to call and ask to speak to one of the nurses for clinical concerns.  If you have a medical  emergency, go to the nearest emergency room or call 911.  A surgeon from [facility] is always on call at the hospital.  For further questions,  please visit [email or web address]    Post Anesthesia Home Care Instructions  Activity: Get plenty of rest for the remainder of the day. A responsible individual must stay with you for 24 hours following the procedure.  For the next 24 hours, DO NOT: -Drive a car -Paediatric nurse -Drink alcoholic beverages -Take any medication unless instructed by your physician -Make any legal decisions or sign important papers.  Meals: Start with liquid foods such as gelatin or soup. Progress to regular foods as tolerated. Avoid greasy, spicy, heavy foods. If nausea and/or vomiting occur, drink only clear liquids until the nausea and/or vomiting subsides. Call your physician if vomiting continues.  Special Instructions/Symptoms: Your throat may feel dry or sore from the anesthesia or the breathing tube placed in your throat during surgery. If this causes discomfort, gargle with warm salt water. The discomfort should disappear within 24 hours.  If you had a scopolamine patch placed behind your ear for the management of post- operative nausea and/or vomiting:  1. The medication in the patch is effective for 72 hours, after which it should be removed.  Wrap patch in a tissue and discard in the trash. Wash hands thoroughly with soap and water. 2. You may remove the patch earlier than 72 hours if you experience unpleasant side effects which may include dry mouth, dizziness or visual disturbances. 3. Avoid touching the patch. Wash your hands with soap and water after contact with the patch.

## 2017-12-03 NOTE — Anesthesia Postprocedure Evaluation (Signed)
Anesthesia Post Note  Patient: [name], quantity  Procedure(s) Performed: LEFT BREAST CENTRAL LUMPECTOMY WITH SENTINEL LYMPH NODE BX, INJECT BLUE DYE LEFT BREAST (Left Breast)     Patient location during evaluation: PACU Anesthesia Type: General Level of consciousness: awake and alert Pain management: pain level controlled Vital Signs Assessment: post-procedure vital signs reviewed and stable Respiratory status: spontaneous breathing, nonlabored ventilation and respiratory function stable Cardiovascular status: blood pressure returned to baseline and stable Postop Assessment: no apparent nausea or vomiting Anesthetic complications: no    Last Vitals:  Vitals:   12/03/17 1248 12/03/17 1303  BP:  (!) 144/64  Pulse: 84 89  Resp: 16 15  Temp:  36.5 C  SpO2: 96% 97%    Last Pain:  Vitals:   12/03/17 1303  TempSrc:   PainSc: 0-No pain                 Audry Pili

## 2017-12-03 NOTE — Transfer of Care (Signed)
Immediate Anesthesia Transfer of Care Note  Patient: Denise Mcclure  Procedure(s) Performed: LEFT BREAST CENTRAL LUMPECTOMY WITH SENTINEL LYMPH NODE BX, INJECT BLUE DYE LEFT BREAST (Left Breast)  Patient Location: PACU  Anesthesia Type:General  Level of Consciousness: sedated  Airway & Oxygen Therapy: Patient Spontanous Breathing and Patient connected to face mask oxygen  Post-op Assessment: Report given to RN and Post -op Vital signs reviewed and stable  Post vital signs: Reviewed and stable  Last Vitals:  Vitals Value Taken Time  BP 145/81 12/03/2017 11:58 AM  Temp    Pulse 88 12/03/2017 12:00 PM  Resp 24 12/03/2017 12:00 PM  SpO2 100 % 12/03/2017 12:00 PM  Vitals shown include unvalidated device data.  Last Pain:  Vitals:   12/03/17 1158  TempSrc:   PainSc: (P) Asleep         Complications: No apparent anesthesia complications

## 2017-12-03 NOTE — Anesthesia Procedure Notes (Signed)
Anesthesia Regional Block: Pectoralis block   Pre-Anesthetic Checklist: ,, timeout performed, Correct Patient, Correct Site, Correct Laterality, Correct Procedure, Correct Position, site marked, Risks and benefits discussed,  Surgical consent,  Pre-op evaluation,  At surgeon's request and post-op pain management  Laterality: Left     Needles:  Injection technique: Single-shot  Needle Type: Stimiplex     Needle Length: 9cm  Needle Gauge: 21     Additional Needles:   Procedures:,,,, ultrasound used (permanent image in chart),,,,  Narrative:  Start time: 12/03/2017 10:02 AM End time: 12/03/2017 10:07 AM Injection made incrementally with aspirations every 5 mL.  Performed by: Personally  Anesthesiologist: Lynda Rainwater, MD

## 2017-12-04 ENCOUNTER — Encounter (HOSPITAL_BASED_OUTPATIENT_CLINIC_OR_DEPARTMENT_OTHER): Payer: Self-pay | Admitting: General Surgery

## 2017-12-10 ENCOUNTER — Ambulatory Visit: Payer: Medicare Other | Admitting: Physician Assistant

## 2017-12-10 NOTE — Progress Notes (Deleted)
  Cardiology Office Note:    Date:  12/10/2017   ID:  Denise Mcclure, DOB [birth year], MRN [id number]  PCP:  Nolene Ebbs, MD  Cardiologist:  Nelva Bush, MD   Referring MD: Nolene Ebbs, MD   No chief complaint on file. ***  History of Present Illness:    Denise Mcclure is a 72 y.o. female with hypertension, hyperlipidemia, tobacco abuse, breast cancer.  She was evaluated by Dr. Saunders Revel for preoperative examination prior to breast surgery.  Her EKG was abnormal with borderline LVH and anterolateral T wave inversions and nonspecific ST segment changes.  ***  [name] ***  Prior CV studies:   The following studies were reviewed today:  Nuclear stress test 11/14/17 Normal resting and stress perfusion. No ischemia or infarction EF 63%  Echocardiogram 11/14/17 Mild focal basal septal hypertrophy, EF 65-70, normal wall motion, grade 1 diastolic dysfunction, MAC, PASP 31  Past Medical History:  Diagnosis Date  . Cancer ([place]) 08/2017   left breast cancer  . Hyperlipidemia   . Hypertension   . Smoker    Surgical Hx: The patient  has a past surgical history that includes No past surgeries and Breast lumpectomy with sentinel lymph node bx (Left, 12/03/2017).   Current Medications: No outpatient medications have been marked as taking for the 12/10/17 encounter (Appointment) with Richardson Dopp T, PA-C.     Allergies:   Patient has no known allergies.   Social History   Tobacco Use  . Smoking status: Current Every Day Smoker    Packs/day: 0.50    Years: 30.00    Pack years: 15.00    Types: Cigarettes  . Smokeless tobacco: Never Used  . Tobacco comment: trying to quit  Substance Use Topics  . Alcohol use: No    Frequency: Never  . Drug use: No     Family Hx: The patient's family history includes Asthma in her mother; Diabetes in her brother and sister; Heart disease in her cousin.  ROS:   Please see the history of present illness.    ROS All other systems  reviewed and are negative.   EKGs/Labs/Other Test Reviewed:    EKG:  EKG is *** ordered today.  The ekg ordered today demonstrates ***  Recent Labs: 12/03/2017: BUN 16; Creatinine, Ser 0.70; Hemoglobin 14.3; Potassium 3.4; Sodium 139   Recent Lipid Panel No results found for: CHOL, TRIG, HDL, CHOLHDL, LDLCALC, LDLDIRECT  Physical Exam:    VS:  There were no vitals taken for this visit.    Wt Readings from Last 3 Encounters:  12/03/17 158 lb 4 oz (71.8 kg)  11/14/17 161 lb (73 kg)  11/07/17 161 lb (73 kg)     ***Physical Exam  ASSESSMENT & PLAN:    No diagnosis found.***  Dispo:  No follow-ups on file.   Medication Adjustments/Labs and Tests Ordered: Current medicines are reviewed at length with the patient today.  Concerns regarding medicines are outlined above.  Tests Ordered: No orders of the defined types were placed in this encounter.  Medication Changes: No orders of the defined types were placed in this encounter.   Signed, Richardson Dopp, PA-C  12/10/2017 8:19 AM    [facility] [address], [place], [place]  [address] Phone: [phone]; Fax: [phone]

## 2017-12-11 NOTE — Progress Notes (Signed)
Inform patient of Pathology report,. Breast pathology reveals that all cancer resected with clear marfgins. Lymph nodes are negative, which is very good news. She will not need any further surgery.  Let me know that you reached her.  Thanks. Hmi

## 2017-12-12 ENCOUNTER — Encounter: Payer: Self-pay | Admitting: Radiation Oncology

## 2017-12-23 NOTE — Progress Notes (Signed)
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  Associated enhancement and thickening of the left areola and periareolar skin.  The periareolar skin thickening and enhancement extends approximately 3-3.5 cm medial and lateral to the nipple defining the extent of skin involvement. -Focal non-mass enhancement within the immediate subareolar LEFT breast, measuring 1.9 x 1.5 cm, consistent with direct subareolar extension of patient's biopsy-proven Paget's disease. -No evidence of additional multifocal or multicentric disease within the left breast, no evidence of malignancy within the right breast, no evidence of metastatic lymphadenopathy.  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 ER 0%/PR 0% (-).  Histology per Pathology Report:  08/30/2017 Pathology Results Diagnosis Breast, biopsy, left   - PAGET'S DISEASE WITH FOCAL SUPERFICIAL INVASION AND DUCTAL CARCINOMA IN SITU INVOLVING LACTIFEROUS DUCTS - DERMAL-EPIDERMAL INTERFACE REACTION WITH LICHENOID INFLAMMATION AND SCATTERED PIGMENTED MELANOPHAGES - ER AND PR WILL BE ORDERED ON BLOCK 1A AND SEPARATELY REPORTED - SEE COMMENT     Post resection 12/03/2017 Left breast path report   Receptor Status: ER(-), PR (-), Her2-neu (), Ki-()  Did patient present with symptoms (if so, please note symptoms) or was this found on screening mammography?: -Patient noticed some changes (peeling, itching) in her left nipple about jan 2018. -Mammogram 08/12/2018: Suspicious for Paget's disease in the left nipple/areola.  No mass seen within th breast parenchyma. - Sampled biopsy on 08/26/2017 which revealed Paget's disease with focal superficial invasion and ductal carcinoma in situ  involving lactiferous ducts in her left breast.   Past/Anticipated interventions by surgeon, if any: Dr. Dalbert Batman 12/03/2017 left central lumpectomy removing the nipple and areola and the deeper tissues and left axillary sentinel node biopsy.  -Inform patient of Pathology report,. Breast pathology reveals that all cancer resected with clear marfgins. Lymph nodes are negative, which is very good news. She will not need any further surgery    Past/Anticipated interventions by medical oncology, if any: Chemotherapy  Dr. Burr Medico 09/10/2017 -Will discuss her case at breast conference tomorrow, and discuss surgical options (lumpectomy vs mastectomy with sentinel node biopsy). I spoke with Dr. Wyonia Hough today  -Will follow up after surgery or radiation    Lymphedema issues, if any:  No  Pain issues, if any:  No  BP (!) 151/89   Pulse 97   Temp 98.7 F (37.1 C)   Resp 16   Ht _0  (1.473 m)   Wt 158 lb (71.7 kg)   SpO2 99%   BMI 33.02 kg/m    Wt Readings from Last 3 Encounters:  12/24/17 158 lb (71.7 kg)  12/03/17 158 lb 4 oz (71.8 kg)  11/14/17 161 lb (73 kg)    SAFETY ISSUES:  Prior radiation? No  Pacemaker/ICD? No  Possible current pregnancy? No  Is the patient on methotrexate? No  Current Complaints / other details:      Cori Razor, RN 12/23/2017,10:18 AM

## 2017-12-24 ENCOUNTER — Encounter: Payer: Self-pay | Admitting: Radiation Oncology

## 2017-12-24 ENCOUNTER — Other Ambulatory Visit: Payer: Self-pay

## 2017-12-24 ENCOUNTER — Ambulatory Visit
Admission: RE | Admit: 2017-12-24 | Discharge: 2017-12-24 | Disposition: A | Discharge status: Home / Self Care | Payer: Medicare Other | Source: Ambulatory Visit | Attending: Radiation Oncology | Admitting: Radiation Oncology

## 2017-12-24 VITALS — BP 151/89 | HR 97 | Temp 98.7°F | Resp 16 | Ht <= 58 in | Wt 158.0 lb

## 2017-12-24 DIAGNOSIS — F17210 Nicotine dependence, cigarettes, uncomplicated: Secondary | ICD-10-CM | POA: Diagnosis not present

## 2017-12-24 DIAGNOSIS — Z171 Estrogen receptor negative status [ER-]: Secondary | ICD-10-CM | POA: Diagnosis not present

## 2017-12-24 DIAGNOSIS — D0512 Intraductal carcinoma in situ of left breast: Secondary | ICD-10-CM

## 2017-12-24 DIAGNOSIS — I10 Essential (primary) hypertension: Secondary | ICD-10-CM | POA: Insufficient documentation

## 2017-12-24 DIAGNOSIS — C50012 Malignant neoplasm of nipple and areola, left female breast: Secondary | ICD-10-CM

## 2017-12-24 DIAGNOSIS — E785 Hyperlipidemia, unspecified: Secondary | ICD-10-CM | POA: Insufficient documentation

## 2017-12-24 DIAGNOSIS — Z79899 Other long term (current) drug therapy: Secondary | ICD-10-CM | POA: Insufficient documentation

## 2017-12-24 NOTE — Progress Notes (Addendum)
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   HISTORY OF PRESENT ILLNESS: Denise Mcclure is a 72 y.o. female seen in the multidisciplinary breast clinic for a new diagnosis of left breast cancer. The patient was noted to have thickening and itching of the left nipple area for about one year. She was seen for mammography and ultrasound which were negative for disease. 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 She has met with Dr. Burr Medico as well and is contemplating mastectomy versus lumpectomy with sentinel node evaluation. She has also undergone MRI scan on 09/09/17 revealing an area of non mass enhancement that measured 1.9 x 1.5 cm, and areolar and periareolar skin enhancement with associated thickening measuring 3-3.5 cm medial and lateral to the nipple.  No adenopathy was noted of either axilla, and her right breast was negative for enhancement.  She underwent left lumpectomy and sentinel node biopsy on 12/03/17 and this revealed multiple foci of microinvasive carcinoma arising from pagetoid changes and DCIS. Of the 4 nodes submitted, 3 specimens contained lymphoid tissue and were all negative for disease, her margins were negative by 5 mm. She does not need chemotherapy per Dr. Burr Medico, and comes today to discuss options of adjuvant radiotherapy.   PREVIOUS RADIATION THERAPY: No   PAST MEDICAL HISTORY:  Past Medical History:    Diagnosis Date  . Cancer ([place]) 08/2017   left breast cancer  . Hyperlipidemia   . Hypertension   . Smoker        PAST SURGICAL HISTORY: Past Surgical History:  Procedure Laterality Date  . BREAST LUMPECTOMY WITH SENTINEL LYMPH NODE BIOPSY Left 12/03/2017   Procedure: LEFT BREAST CENTRAL LUMPECTOMY WITH SENTINEL LYMPH NODE BX, INJECT BLUE DYE LEFT BREAST;  Surgeon: Fanny Skates, MD;  Location: [place];  Service: General;  Laterality: Left;  . NO PAST SURGERIES       FAMILY HISTORY:  Family History  Problem Relation Age of Onset  . Diabetes Sister   . Diabetes Brother   . Asthma Mother   . Heart disease Cousin      SOCIAL HISTORY:  reports that she has been smoking cigarettes.  She has a 15.00 pack-year smoking history. She has never used smokeless tobacco. She reports that she does not drink alcohol or use drugs. The patient is accompanied by her daughter.   ALLERGIES: Patient has no known allergies.   MEDICATIONS:  Current Outpatient Medications  Medication Sig Dispense Refill  . alendronate (FOSAMAX) 70 MG tablet Take 70 mg by mouth once a week.  2  . ibuprofen (ADVIL,MOTRIN) 200 MG tablet Take 200 mg by mouth every 6 (six) hours as needed (pain).    Marland Kitchen losartan-hydrochlorothiazide (HYZAAR) 50-12.5 MG tablet Take 1 tablet by mouth daily.  1  . lovastatin (MEVACOR) 10 MG tablet Take 10 mg by mouth daily.  3  . Multiple Vitamins-Minerals (MULTIVITAMIN PO) Take 1 tablet by mouth daily.    Marland Kitchen  dorzolamide (TRUSOPT) 2 % ophthalmic solution     . Omega-3 Fatty Acids (FISH OIL PO) Take 1 tablet by mouth 2 (two) times daily.    Marland Kitchen zolpidem (AMBIEN) 10 MG tablet Take 1 tablet by mouth at bedtime as needed.  2   No current facility-administered medications for this encounter.      REVIEW OF SYSTEMS: On review of systems, the patient reports that she is doing well overall. She's healing well without concerns and sees Dr. Dalbert Batman on Thursday for a postop check.  She denies any chest pain, shortness of breath, cough, fevers, chills, night sweats, unintended weight changes. She denies any bowel or bladder disturbances, and denies abdominal pain, nausea or vomiting. She denies any new musculoskeletal or joint aches or pains. A complete review of systems is obtained and is otherwise negative.     PHYSICAL EXAM:  Wt Readings from Last 3 Encounters:  12/24/17 158 lb (71.7 kg)  12/03/17 158 lb 4 oz (71.8 kg)  11/14/17 161 lb (73 kg)   Temp Readings from Last 3 Encounters:  12/24/17 98.7 F (37.1 C)  12/03/17 97.7 F (36.5 C)  10/01/17 98.3 F (36.8 C) (Oral)   BP Readings from Last 3 Encounters:  12/24/17 (!) 151/89  12/03/17 (!) 144/64  11/07/17 (!) 132/92   Pulse Readings from Last 3 Encounters:  12/24/17 97  12/03/17 89  11/07/17 (!) 103     In general this is a well appearing [place] female in no acute distress. She is alert and oriented x4 and appropriate throughout the examination. HEENT reveals that the patient is normocephalic, atraumatic. EOMs are intact. PERRLA. Skin is intact without any evidence of gross lesions. Cardiopulmonary assessment is negative for acute distress and she exhibits normal effort.  Breast on the left reveals a well healed lumpectomy scar with interval removal of the nipple areolar complex. The left axillary incision site is also intact. No erythema is noted, nor is there fullness.    ECOG =0  0 - Asymptomatic (Fully active, able to carry on all predisease activities without restriction)  1 - Symptomatic but completely ambulatory (Restricted in physically strenuous activity but ambulatory and able to carry out work of a light or sedentary nature. For example, light housework, office work)  2 - Symptomatic, <50% in bed during the day (Ambulatory and capable of all self care but unable to carry out any work activities. Up and about more than 50% of waking hours)  3 - Symptomatic, >50% in bed, but not  bedbound (Capable of only limited self-care, confined to bed or chair 50% or more of waking hours)  4 - Bedbound (Completely disabled. Cannot carry on any self-care. Totally confined to bed or chair)  5 - Death   Eustace Pen MM, Creech RH, Tormey DC, et al. [phone]). "Toxicity and response criteria of the [facility]". [place] Oncol. 5 (6): 649-55    LABORATORY DATA:  Lab Results  Component Value Date   HGB 14.3 12/03/2017   HCT 42.0 12/03/2017   Lab Results  Component Value Date   NA 139 12/03/2017   K 3.4 (L) 12/03/2017   CL 105 12/03/2017   CO2 25 10/11/2017   No results found for: ALT, AST, GGT, ALKPHOS, BILITOT    RADIOGRAPHY: Nm Sentinel Node Inj-no Rpt (breast)  Result Date: 12/03/2017 Sulfur colloid was injected by the nuclear medicine technologist for melanoma sentinel node.       IMPRESSION/PLAN: 1. ER PR negative,  multifocal microinvasive disease arising from Paget's disease and  DCIS of the left breast. Dr. Lisbeth Renshaw discusses the pathology findings and reviews the nature of paget's disease of the breast as well as the microinvasive component. The patient does not have an indication for chemotherapy per Dr. Burr Medico and is ready to proceed with adjuvant radiotherapy. We discussed the risks, benefits, short, and long term effects of radiotherapy, and the patient is interested in proceeding. Dr. Lisbeth Renshaw discusses the delivery and logistics of radiotherapy and anticipates a course of 4 weeks. Written consent is obtained and placed in the chart, a copy was provided to the patient. She will return on Friday for simulation.   In a visit lasting 25 minutes, greater than 50% of the time was spent face to face discussing her case, and coordinating the patient's care.  The above documentation reflects my direct findings during this shared patient visit. Please see the separate note by Dr. Lisbeth Renshaw on this date for the remainder of the patient's plan of care.    Carola Rhine, PAC

## 2017-12-27 ENCOUNTER — Ambulatory Visit
Admission: RE | Admit: 2017-12-27 | Discharge: 2017-12-27 | Disposition: A | Discharge status: Home / Self Care | Payer: Medicare Other | Source: Ambulatory Visit | Attending: Radiation Oncology | Admitting: Radiation Oncology

## 2017-12-27 DIAGNOSIS — D0512 Intraductal carcinoma in situ of left breast: Secondary | ICD-10-CM | POA: Insufficient documentation

## 2017-12-27 DIAGNOSIS — Z510 Encounter for antineoplastic radiation therapy: Secondary | ICD-10-CM | POA: Diagnosis present

## 2017-12-30 NOTE — Progress Notes (Signed)
  Radiation Oncology         [phone] ________________________________  Name: Denise Mcclure MRN: [id number]  Date: 12/27/2017  DOB: [birth year]  Optical Surface Tracking Plan:  Since intensity modulated radiotherapy (IMRT) and 3D conformal radiation treatment methods are predicated on accurate and precise positioning for treatment, intrafraction motion monitoring is medically necessary to ensure accurate and safe treatment delivery.  The ability to quantify intrafraction motion without excessive ionizing radiation dose can only be performed with optical surface tracking. Accordingly, surface imaging offers the opportunity to obtain 3D measurements of patient position throughout IMRT and 3D treatments without excessive radiation exposure.  I am ordering optical surface tracking for this patient's upcoming course of radiotherapy. ________________________________  Kyung Rudd, MD 12/30/2017 9:00 AM    Reference:   Ursula Alert, J, et al. Surface imaging-based analysis of intrafraction motion for breast radiotherapy patients.Journal of [place], n. 6, nov. 2014. ISSN [id number].   Available at: <http://www.jacmp.org/index.php/jacmp/article/view/4957>.

## 2017-12-30 NOTE — Progress Notes (Signed)
  Radiation Oncology         [phone]) [phone] ________________________________  Name: Denise Mcclure MRN: [id number]  Date: 12/27/2017  DOB: Sep 15, 1945   DIAGNOSIS:     ICD-10-CM   1. Ductal carcinoma in situ (DCIS) of left breast D05.12     SIMULATION AND TREATMENT PLANNING NOTE  The patient presented for simulation prior to beginning her course of radiation treatment for her diagnosis of left-sided breast cancer. The patient was placed in a supine position on a breast board. A customized vac-lock bag was constructed and this complex treatment device will be used on a daily basis during her treatment. In this fashion, a CT scan was obtained through the chest area and an isocenter was placed near the chest wall within the breast.  The patient will be planned to receive a course of radiation initially to a dose of 42.5 Gy. This will consist of a whole breast radiotherapy technique. To accomplish this, 2 customized blocks have been designed which will correspond to medial and lateral whole breast tangent fields. This treatment will be accomplished at 2.5 Gy per fraction. A forward planning technique will also be evaluated to determine if this approach improves the plan. It is anticipated that the patient will then receive a 7.5 Gy boost to the seroma cavity which has been contoured. This will be accomplished at 2.5 Gy per fraction.   This initial treatment will consist of a 3-D conformal technique. The seroma has been contoured as the primary target structure. Additionally, dose volume histograms of both this target as well as the lungs and heart will also be evaluated. Such an approach is necessary to ensure that the target area is adequately covered while the nearby critical  normal structures are adequately spared.  Plan:  The final anticipated total dose therefore will correspond to 50 Gy.    _______________________________   Jodelle Gross, MD, PhD

## 2017-12-31 DIAGNOSIS — Z510 Encounter for antineoplastic radiation therapy: Secondary | ICD-10-CM | POA: Diagnosis not present

## 2018-01-03 ENCOUNTER — Ambulatory Visit
Admission: RE | Admit: 2018-01-03 | Discharge: 2018-01-03 | Disposition: A | Discharge status: Home / Self Care | Payer: Medicare Other | Source: Ambulatory Visit | Attending: Radiation Oncology | Admitting: Radiation Oncology

## 2018-01-03 DIAGNOSIS — Z510 Encounter for antineoplastic radiation therapy: Secondary | ICD-10-CM | POA: Diagnosis not present

## 2018-01-06 ENCOUNTER — Ambulatory Visit
Admission: RE | Admit: 2018-01-06 | Discharge: 2018-01-06 | Disposition: A | Discharge status: Home / Self Care | Payer: Medicare Other | Source: Ambulatory Visit | Attending: Radiation Oncology | Admitting: Radiation Oncology

## 2018-01-06 DIAGNOSIS — Z510 Encounter for antineoplastic radiation therapy: Secondary | ICD-10-CM | POA: Diagnosis not present

## 2018-01-07 ENCOUNTER — Ambulatory Visit
Admission: RE | Admit: 2018-01-07 | Discharge: 2018-01-07 | Disposition: A | Discharge status: Home / Self Care | Payer: Medicare Other | Source: Ambulatory Visit | Attending: Radiation Oncology | Admitting: Radiation Oncology

## 2018-01-07 DIAGNOSIS — Z510 Encounter for antineoplastic radiation therapy: Secondary | ICD-10-CM | POA: Diagnosis not present

## 2018-01-08 ENCOUNTER — Ambulatory Visit
Admission: RE | Admit: 2018-01-08 | Discharge: 2018-01-08 | Disposition: A | Discharge status: Home / Self Care | Payer: Medicare Other | Source: Ambulatory Visit | Attending: Radiation Oncology | Admitting: Radiation Oncology

## 2018-01-08 DIAGNOSIS — Z510 Encounter for antineoplastic radiation therapy: Secondary | ICD-10-CM | POA: Diagnosis not present

## 2018-01-09 ENCOUNTER — Ambulatory Visit
Admission: RE | Admit: 2018-01-09 | Discharge: 2018-01-09 | Disposition: A | Discharge status: Home / Self Care | Payer: Medicare Other | Source: Ambulatory Visit | Attending: Radiation Oncology | Admitting: Radiation Oncology

## 2018-01-09 DIAGNOSIS — Z510 Encounter for antineoplastic radiation therapy: Secondary | ICD-10-CM | POA: Diagnosis not present

## 2018-01-10 ENCOUNTER — Ambulatory Visit
Admission: RE | Admit: 2018-01-10 | Discharge: 2018-01-10 | Disposition: A | Discharge status: Home / Self Care | Payer: Medicare Other | Source: Ambulatory Visit | Attending: Radiation Oncology | Admitting: Radiation Oncology

## 2018-01-10 DIAGNOSIS — D0512 Intraductal carcinoma in situ of left breast: Secondary | ICD-10-CM

## 2018-01-10 DIAGNOSIS — Z510 Encounter for antineoplastic radiation therapy: Secondary | ICD-10-CM | POA: Diagnosis not present

## 2018-01-10 MED ORDER — RADIAPLEXRX EX GEL
Freq: Once | CUTANEOUS | Status: AC
  Administered 2018-01-10: 14:00:00 via TOPICAL

## 2018-01-10 NOTE — Progress Notes (Signed)
Pt here for patient teaching.  Pt given Radiation and You booklet, Managing Acute Radiation Side Effects for Head and Neck Cancer handout, skin care instructions and Radiaplex gel (Alra deodorant is on back order so pt will be provided once restocked. Instructed to use a generic non-metallic deodorant in the meantime).  Reviewed areas of pertinence such as hair loss, skin changes, breast tenderness and breast swelling . Pt able to give teach back of to pat skin, use unscented/gentle soap, use baby wipes and drink plenty of water,apply Radiaplex bid, avoid applying anything to skin within 4 hours of treatment, avoid wearing an under wire bra and to use an electric razor if they must shave. Pt demonstrated understanding and verbalizes understanding of information given and will contact nursing with any questions or concerns.     Http://rtanswers.org/treatmentinformation/whattoexpect/index

## 2018-01-13 ENCOUNTER — Ambulatory Visit
Admission: RE | Admit: 2018-01-13 | Discharge: 2018-01-13 | Disposition: A | Discharge status: Home / Self Care | Payer: Medicare Other | Source: Ambulatory Visit | Attending: Radiation Oncology | Admitting: Radiation Oncology

## 2018-01-13 DIAGNOSIS — Z510 Encounter for antineoplastic radiation therapy: Secondary | ICD-10-CM | POA: Diagnosis not present

## 2018-01-14 ENCOUNTER — Ambulatory Visit
Admission: RE | Admit: 2018-01-14 | Discharge: 2018-01-14 | Disposition: A | Discharge status: Home / Self Care | Payer: Medicare Other | Source: Ambulatory Visit | Attending: Radiation Oncology | Admitting: Radiation Oncology

## 2018-01-14 DIAGNOSIS — Z510 Encounter for antineoplastic radiation therapy: Secondary | ICD-10-CM | POA: Diagnosis not present

## 2018-01-15 ENCOUNTER — Ambulatory Visit
Admission: RE | Admit: 2018-01-15 | Discharge: 2018-01-15 | Disposition: A | Discharge status: Home / Self Care | Payer: Medicare Other | Source: Ambulatory Visit | Attending: Radiation Oncology | Admitting: Radiation Oncology

## 2018-01-15 DIAGNOSIS — Z510 Encounter for antineoplastic radiation therapy: Secondary | ICD-10-CM | POA: Diagnosis not present

## 2018-01-16 ENCOUNTER — Ambulatory Visit
Admission: RE | Admit: 2018-01-16 | Discharge: 2018-01-16 | Disposition: A | Discharge status: Home / Self Care | Payer: Medicare Other | Source: Ambulatory Visit | Attending: Radiation Oncology | Admitting: Radiation Oncology

## 2018-01-16 DIAGNOSIS — Z510 Encounter for antineoplastic radiation therapy: Secondary | ICD-10-CM | POA: Diagnosis not present

## 2018-01-17 ENCOUNTER — Ambulatory Visit
Admission: RE | Admit: 2018-01-17 | Discharge: 2018-01-17 | Disposition: A | Discharge status: Home / Self Care | Payer: Medicare Other | Source: Ambulatory Visit | Attending: Radiation Oncology | Admitting: Radiation Oncology

## 2018-01-17 DIAGNOSIS — Z510 Encounter for antineoplastic radiation therapy: Secondary | ICD-10-CM | POA: Diagnosis not present

## 2018-01-21 ENCOUNTER — Ambulatory Visit
Admission: RE | Admit: 2018-01-21 | Discharge: 2018-01-21 | Disposition: A | Discharge status: Home / Self Care | Payer: Medicare Other | Source: Ambulatory Visit | Attending: Radiation Oncology | Admitting: Radiation Oncology

## 2018-01-21 DIAGNOSIS — Z510 Encounter for antineoplastic radiation therapy: Secondary | ICD-10-CM | POA: Diagnosis not present

## 2018-01-22 ENCOUNTER — Ambulatory Visit
Admission: RE | Admit: 2018-01-22 | Discharge: 2018-01-22 | Disposition: A | Discharge status: Home / Self Care | Payer: Medicare Other | Source: Ambulatory Visit | Attending: Radiation Oncology | Admitting: Radiation Oncology

## 2018-01-22 DIAGNOSIS — Z510 Encounter for antineoplastic radiation therapy: Secondary | ICD-10-CM | POA: Diagnosis not present

## 2018-01-23 ENCOUNTER — Ambulatory Visit
Admission: RE | Admit: 2018-01-23 | Discharge: 2018-01-23 | Disposition: A | Discharge status: Home / Self Care | Payer: Medicare Other | Source: Ambulatory Visit | Attending: Radiation Oncology | Admitting: Radiation Oncology

## 2018-01-23 DIAGNOSIS — Z510 Encounter for antineoplastic radiation therapy: Secondary | ICD-10-CM | POA: Diagnosis not present

## 2018-01-24 ENCOUNTER — Ambulatory Visit: Payer: Medicare Other | Admitting: Radiation Oncology

## 2018-01-24 ENCOUNTER — Ambulatory Visit
Admission: RE | Admit: 2018-01-24 | Discharge: 2018-01-24 | Disposition: A | Discharge status: Home / Self Care | Payer: Medicare Other | Source: Ambulatory Visit | Attending: Radiation Oncology | Admitting: Radiation Oncology

## 2018-01-24 DIAGNOSIS — Z510 Encounter for antineoplastic radiation therapy: Secondary | ICD-10-CM | POA: Diagnosis not present

## 2018-01-27 ENCOUNTER — Ambulatory Visit
Admission: RE | Admit: 2018-01-27 | Discharge: 2018-01-27 | Disposition: A | Discharge status: Home / Self Care | Payer: Medicare Other | Source: Ambulatory Visit | Attending: Radiation Oncology | Admitting: Radiation Oncology

## 2018-01-27 DIAGNOSIS — C50112 Malignant neoplasm of central portion of left female breast: Secondary | ICD-10-CM | POA: Insufficient documentation

## 2018-01-27 DIAGNOSIS — Z510 Encounter for antineoplastic radiation therapy: Secondary | ICD-10-CM | POA: Insufficient documentation

## 2018-01-27 DIAGNOSIS — Z171 Estrogen receptor negative status [ER-]: Secondary | ICD-10-CM | POA: Insufficient documentation

## 2018-01-28 ENCOUNTER — Ambulatory Visit
Admission: RE | Admit: 2018-01-28 | Discharge: 2018-01-28 | Disposition: A | Discharge status: Home / Self Care | Payer: Medicare Other | Source: Ambulatory Visit | Attending: Radiation Oncology | Admitting: Radiation Oncology

## 2018-01-28 DIAGNOSIS — Z510 Encounter for antineoplastic radiation therapy: Secondary | ICD-10-CM | POA: Diagnosis not present

## 2018-01-28 NOTE — Progress Notes (Signed)
[facility]  [phone] [phone]  Clinic Follow Up Note   Patient Care Team: Nolene Ebbs, MD as PCP - General (Internal Medicine) End, Harrell Gave, MD as PCP - Cardiology (Cardiology) Fanny Skates, MD as Consulting Physician (General Surgery) Truitt Merle, MD as Consulting Physician (Hematology) 01/29/2018   CHIEF COMPLAINTS:  F/u left breast cancer   Oncology History   Cancer Staging Cancer of central portion of left female breast [facility]) Staging form: Breast, AJCC 8th Edition - Pathologic stage from 12/03/2017: pT36m, pN0, cM0, G3, ER-, PR-, HER2: Not Assessed - Signed by FTruitt Merle MD on 01/30/2018  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 The skin thickening and hypervascularity in the region of physical exam abnormality is suspicious for Paget's disease. No mass seen within the breast parenchyma.      08/30/2017 Pathology Results    Diagnosis Breast, biopsy, left   - PAGET'S DISEASE WITH FOCAL SUPERFICIAL INVASION AND DUCTAL CARCINOMA IN SITU INVOLVING LACTIFEROUS DUCTS - DERMAL-EPIDERMAL INTERFACE REACTION WITH LICHENOID INFLAMMATION AND SCATTERED PIGMENTED MELANOPHAGES - ER AND PR WILL BE ORDERED ON BLOCK 1A AND SEPARATELY REPORTED - SEE COMMENT      08/30/2017 Receptors her2    ER, 0% negative and PR, 0% negative, HER2, pending        09/09/2017 Initial Diagnosis    Ductal carcinoma in situ (DCIS) of left breast      09/09/2017 Imaging    BILATERAL BREAST MRI WITH AND WITHOUT CONTRAST IMPRESSION: 1. Known biopsy-proven  Paget's disease of the LEFT nipple. There is associated enhancement and thickening of the left areola and periareolar skin. The periareolar skin thickening and enhancement extends approximately 3-3.5 cm medial and lateral to the nipple defining the extent of skin involvement. 2. Focal non-mass enhancement within the immediate subareolar LEFT breast, measuring 1.9 x 1.5 cm, consistent with direct subareolar extension of patient's biopsy-proven Paget's disease. 3. No evidence of additional multifocal or multicentric disease within the left breast. 4. No evidence of malignancy within the right breast. 5. No evidence of metastatic lymphadenopathy.      12/03/2017 Pathology Results    Breast, lumpectomy, Left - MULTIPLE FOCI OF MICROINVASIVE CARCINOMA ARISING FROM PAGETOID SPREAD OF HIGH-GRADE DUCTAL CARCINOMA IN SITU TO THE NIPPLE AND ADJACENT SKIN. SEE NOTE - PAGETOID SPREAD INVOLVES 4.5 CM OF THE SKIN. - ALL RESECTION MARGINS, INCLUDING THE SKIN, ARE NEGATIVE FOR DCIS AND PAGETOID SPREAD. - NEGATIVE FOR LYMPHOVASCULAR INVASION - BIOPSY SITE CHANGE. - SEE ONCOLOGY TABLE. 2. Lymph node, sentinel, biopsy, Left Axillary #1 - LYMPH NODE, NEGATIVE FOR CARCINOMA (0/1). 3. Lymph node, sentinel, biopsy, Left Axillary #2 - LYMPH NODE, NEGATIVE FOR CARCINOMA (0/1). 4. Lymph node, sentinel, biopsy, Left Axillary #3 - FIBROADIPOSE TISSUE, NEGATIVE FOR CARCINOMA. - LYMPHOID TISSUE IS NOT IDENTIFIED. 5. Lymph node, sentinel, biopsy, Left Axillary #4 - LYMPH NODE, NEGATIVE FOR CARCINOMA (0/1).      12/03/2017 Cancer Staging    Staging form: Breast, AJCC 8th Edition - Pathologic stage from 12/03/2017: pT142m pN0, cM0, G3, ER-, PR-, HER2: Not Assessed - Signed by FeTruitt Merle  MD on 01/30/2018        HISTORY OF PRESENTING ILLNESS:  Denise Mcclure 72 y.o. 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 She presents with her daughter. She was referred her by her  surgeon, Dr. Dalbert Batman. Pt first notices a skin changed 1 year ago. She noticed that her left nipple was peeling and itching with no discharge. It has not increased in since and she has no pain currently.   Pt has HTN and HLD and she takes livostin and losartan prescribed by her PCP, Dr. Margy Clarks. She also takes Fosamax weekly. She sees him every 6 months and gets routine blood work. She reports no recent abnormal results. No hx of DM, CAD, COPD, CKD and no hx of surgery. She has no FHx of CA. She does have a FHx of DM, and HTN. Pt smokes 1/4 pack of cigarettes a day, she is try to quit. She does not drink alcohol.    Pt had a mammogram on 08/12/17 with results inially revealing that the physical exam is suspicious for Paget's disease in the left Nipple/areola, the skin thickening and hypervascularity in the region of physical exam abnormality is suspicious for Paget's Disease and that there was no mass seen within the breast parenchyma. Pt had a sample biopsied on 08/26/17 with results revealing Paget's Disease with focal superficial invasion and ductal carcinoma in situ involving lactiferous ducts in her left breast.   Her breast MRI on 09/09/17 reveals known biopsy-proven Paget's disease of the LEFT nipple. There is associated enhancement and thickening of the left areola and periareolar skin. The periareolar skin thickening and enhancement extends approximately 3-3.5 cm medial and lateral to the nipple defining the extent of skin involvement. There is focal non-mass enhancement within the immediate subareolar LEFT breast, measuring 1.9 x 1.5 cm, consistent with direct subareolar extension of patient's biopsy-proven Paget's disease. There is no evidence of additional multifocal or multicentric disease within the left breast, malignancy within the right breast or metastatic lymphadenopathy.  Prognostic indicators significant for: ER, 0% negative and PR, 0% negative  On review of systems, pt denies chest  pain, abdominal pain Pertinent positives are listed and detailed within the above HPI.   CURRENT THERAPY: Adjuvant radiation   INTERVAL HISTORY:  [name] presents to the office today for follow up of left breast cancer. Pt presents to the office today accompanied by her daughter today. She reports that she is doing well overall. She has been doing well with radiation treatments and she will complete treatment on Monday, 02/03/2018  Of note since the patient last visit, she has had a left breast lumpectomy completed on 12/03/2017 with results revealing Breast, lumpectomy, Left with multiple foci of microinvasive carcinoma arising from pagetoid spread of high-grade ductal carcinoma in situ to the nipple and adjacent skin. Pagetoid spread involved 4.5 cm of the skin. All resection margins, including the skin, are negative for DCIS and pagetoid spread. Negative for lymphovascular invasion. Biopsy site change. Lymph node, sentinel, biopsy, Left Axillary #1 with lymph node, negative for carcinoma (0/1). Lymph node, sentinel, biopsy, Left Axillary #2 with lymph node, negative for carcinoma (0/1). Lymph node, sentinel, biopsy, Left Axillary #3 with fibroadipose tissue, negative for carcinoma. Lymphoid tissue is not identified. Lymph node, sentinel, biopsy, Left Axillary #4 with lymph node, negative for carcinoma.    On review of systems, she reports redness and itching to her left breast. she denies left breast pain, CP, SOB, abdominal  issues, and any other symptoms. Pertinent positives are listed and detailed within the above HPI.    MEDICAL HISTORY:  Past Medical History:  Diagnosis Date  . Cancer ([place]) 08/2017   left breast cancer  . Hyperlipidemia   . Hypertension   . Smoker     SURGICAL HISTORY: Past Surgical History:  Procedure Laterality Date  . BREAST LUMPECTOMY WITH SENTINEL LYMPH NODE BIOPSY Left 12/03/2017   Procedure: LEFT BREAST CENTRAL LUMPECTOMY WITH SENTINEL LYMPH NODE BX, INJECT BLUE  DYE LEFT BREAST;  Surgeon: Fanny Skates, MD;  Location: [facility];  Service: General;  Laterality: Left;  . NO PAST SURGERIES      SOCIAL HISTORY: Social History   Socioeconomic History  . Marital status: Widowed    Spouse name: Not on file  . Number of children: Not on file  . Years of education: Not on file  . Highest education level: Not on file  Occupational History  . Not on file  Social Needs  . Financial resource strain: Not on file  . Food insecurity:    Worry: Not on file    Inability: Not on file  . Transportation needs:    Medical: Not on file    Non-medical: Not on file  Tobacco Use  . Smoking status: Current Every Day Smoker    Packs/day: 0.50    Years: 30.00    Pack years: 15.00    Types: Cigarettes  . Smokeless tobacco: Never Used  . Tobacco comment: trying to quit  Substance and Sexual Activity  . Alcohol use: No    Frequency: Never  . Drug use: No  . Sexual activity: Never    Birth control/protection: Post-menopausal  Lifestyle  . Physical activity:    Days per week: Not on file    Minutes per session: Not on file  . Stress: Not on file  Relationships  . Social connections:    Talks on phone: Not on file    Gets together: Not on file    Attends religious service: Not on file    Active member of club or organization: Not on file    Attends meetings of clubs or organizations: Not on file    Relationship status: Not on file  . Intimate partner violence:    Fear of current or ex partner: Not on file    Emotionally abused: Not on file    Physically abused: Not on file    Forced sexual activity: Not on file  Other Topics Concern  . Not on file  Social History Narrative  . Not on file    FAMILY HISTORY: Family History  Problem Relation Age of Onset  . Diabetes Sister   . Diabetes Brother   . Asthma Mother   . Heart disease Cousin     ALLERGIES:  has No Known Allergies.  MEDICATIONS:  Current Outpatient Medications    Medication Sig Dispense Refill  . alendronate (FOSAMAX) 70 MG tablet Take 70 mg by mouth once a week.  2  . dorzolamide (TRUSOPT) 2 % ophthalmic solution     . ibuprofen (ADVIL,MOTRIN) 200 MG tablet Take 200 mg by mouth every 6 (six) hours as needed (pain).    Marland Kitchen losartan-hydrochlorothiazide (HYZAAR) 50-12.5 MG tablet Take 1 tablet by mouth daily.  1  . lovastatin (MEVACOR) 10 MG tablet Take 10 mg by mouth daily.  3  . Multiple Vitamins-Minerals (MULTIVITAMIN PO) Take 1 tablet by mouth daily.    . Omega-3 Fatty  Acids (FISH OIL PO) Take 1 tablet by mouth 2 (two) times daily.    Marland Kitchen zolpidem (AMBIEN) 10 MG tablet Take 1 tablet by mouth at bedtime as needed.  2   No current facility-administered medications for this visit.     REVIEW OF SYSTEMS:   Constitutional: Denies fevers, chills or abnormal night sweats Eyes: Denies blurriness of vision, double vision or watery eyes Ears, nose, mouth, throat, and face: Denies mucositis or sore throat Respiratory: Denies cough, dyspnea or wheezes Cardiovascular: Denies palpitation, chest discomfort or lower extremity swelling Gastrointestinal:  Denies nausea, heartburn or change in bowel habits Skin: Denies abnormal skin rashes (+) redness and itching to left breast Lymphatics: Denies new lymphadenopathy or easy bruising Neurological:Denies numbness, tingling or new weaknesses Behavioral/Psych: Mood is stable, no new changes  All other systems were reviewed with the patient and are negative.  PHYSICAL EXAMINATION:  ECOG PERFORMANCE STATUS: 0 - Asymptomatic  Vitals:   01/29/18 0930 01/29/18 0932  BP: (!) 157/95 (!) 163/90  Pulse: 85 84  Resp:    Temp:    SpO2:     Filed Weights   01/29/18 0917  Weight: 155 lb 9.6 oz (70.6 kg)    GENERAL:alert, no distress and comfortable SKIN: skin color, texture, turgor are normal, no rashes or significant lesions EYES: normal, conjunctiva are pink and non-injected, sclera clear OROPHARYNX:no exudate, no  erythema and lips, buccal mucosa, and tongue normal  NECK: supple, thyroid normal size, non-tender, without nodularity LYMPH:  no palpable lymphadenopathy in the cervical, axillary or inguinal LUNGS: clear to auscultation and percussion with normal breathing effort HEART: regular rate & rhythm and no murmurs and no lower extremity edema ABDOMEN:abdomen soft, non-tender and normal bowel sounds Musculoskeletal:no cyanosis of digits and no clubbing  PSYCH: alert & oriented x 3 with fluent speech NEURO: no focal motor/sensory deficits BREAST: Left breast with diffuse skin erythema without skin ulcers or discharge.     LABORATORY DATA:  I have reviewed the data as listed CBC Latest Ref Rng & Units 12/03/2017 03/21/2015  Hemoglobin 12.0 - 15.0 g/dL 14.3 14.6  Hematocrit 36.0 - 46.0 % 42.0 43.0    CMP Latest Ref Rng & Units 12/03/2017 10/11/2017 03/21/2015  Glucose 65 - 99 mg/dL 85 85 94  BUN 6 - 20 mg/dL 16 17 27(H)  Creatinine 0.44 - 1.00 mg/dL 0.70 0.76 0.90  Sodium 135 - 145 mmol/L 139 142 140  Potassium 3.5 - 5.1 mmol/L 3.4(L) 4.5 4.2  Chloride 101 - 111 mmol/L 105 105 100(L)  CO2 22 - 32 mmol/L - 25 -  Calcium 8.9 - 10.3 mg/dL - 9.0 -     PATHOLOGY: 08/30/2017 Diagnosis Breast, biopsy, left   - PAGET'S DISEASE WITH FOCAL SUPERFICIAL INVASION AND DUCTAL CARCINOMA IN SITU INVOLVING LACTIFEROUS DUCTS - DERMAL-EPIDERMAL INTERFACE REACTION WITH LICHENOID INFLAMMATION AND SCATTERED PIGMENTED MELANOPHAGES - ER AND PR WILL BE ORDERED ON BLOCK 1A AND SEPARATELY REPORTED - SEE COMMENT  RADIOGRAPHIC STUDIES: I have personally reviewed the radiological images as listed and agreed with the findings in the report. No results found.  ASSESSMENT & PLAN: 72 yo female, originally from [place]   1. 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 I  went over the pathology with the patient in detail. We discussed her Paget's Disease and the non-invasive nature of the DCIS.  -Pathology  results from 08/26/17 reveal Paget's Disease with focal superficial invasion and ductal carcinoma in situ involving lactiferous ducts in her left breast. I discussed that her DCIS was negative for PR and ER receptors. --Left breast lumpectomy completed on 12/03/2017 with multiple foci of microinvasive carcinoma arising from pagetoid spread of high-grade ductal carcinoma in situ to the nipple and adjacent skin. Pagetoid spread involved 4.5 cm of the skin. All resection margins, including the skin, are negative for DCIS and pagetoid spread. Negative for lymphovascular invasion. All lymph nodes negative for carcinoma.  -Discussed her surgical pathology studies with the patient and her daughter today. -given the TmiN0 disease, no need for adjuvant chemo  -she is finishing radiation soon   -Due to the patient's tumor being ER/PR negative, I do not recommend adjuvant antiestrogen therapy.  -we discussed very small risk of recurrent, and her moderate risk of second breast cancer in future. We discussed surveillance  --Advised the patient to maintain her mammograms yearly. I encourage her to eat healthy and exercise  -Follow up in 6 months   PLAN: Survivorship clinic in 3 month  Lab and f/u in 6 months      No orders of the defined types were placed in this encounter.   All questions were answered. The patient knows to call the clinic with any problems, questions or concerns. I spent 15 minutes counseling the patient face to face. The total time spent in the appointment was 20 and more than 50% was on counseling.  I, Soijett Blue am acting as scribe for Dr. Truitt Merle.  I have reviewed the above documentation for accuracy and completeness, and I agree with the above.    Truitt Merle, MD 01/29/2018

## 2018-01-29 ENCOUNTER — Telehealth: Payer: Self-pay

## 2018-01-29 ENCOUNTER — Ambulatory Visit
Admission: RE | Admit: 2018-01-29 | Discharge: 2018-01-29 | Disposition: A | Discharge status: Home / Self Care | Payer: Medicare Other | Source: Ambulatory Visit | Attending: Radiation Oncology | Admitting: Radiation Oncology

## 2018-01-29 ENCOUNTER — Inpatient Hospital Stay: Payer: Medicare Other | Attending: Hematology | Admitting: Hematology

## 2018-01-29 VITALS — BP 163/90 | HR 84 | Temp 98.2°F | Resp 18 | Ht <= 58 in | Wt 155.6 lb

## 2018-01-29 DIAGNOSIS — Z923 Personal history of irradiation: Secondary | ICD-10-CM | POA: Diagnosis not present

## 2018-01-29 DIAGNOSIS — C50112 Malignant neoplasm of central portion of left female breast: Secondary | ICD-10-CM | POA: Insufficient documentation

## 2018-01-29 DIAGNOSIS — Z720 Tobacco use: Secondary | ICD-10-CM | POA: Diagnosis not present

## 2018-01-29 DIAGNOSIS — Z171 Estrogen receptor negative status [ER-]: Secondary | ICD-10-CM | POA: Diagnosis not present

## 2018-01-29 DIAGNOSIS — Z510 Encounter for antineoplastic radiation therapy: Secondary | ICD-10-CM | POA: Diagnosis not present

## 2018-01-29 NOTE — Telephone Encounter (Signed)
Printed avs and calender of upcoming appointment. Per 6/5 los

## 2018-01-30 ENCOUNTER — Encounter: Payer: Self-pay | Admitting: Hematology

## 2018-01-30 ENCOUNTER — Ambulatory Visit
Admission: RE | Admit: 2018-01-30 | Discharge: 2018-01-30 | Disposition: A | Discharge status: Home / Self Care | Payer: Medicare Other | Source: Ambulatory Visit | Attending: Radiation Oncology | Admitting: Radiation Oncology

## 2018-01-30 DIAGNOSIS — Z510 Encounter for antineoplastic radiation therapy: Secondary | ICD-10-CM | POA: Diagnosis not present

## 2018-01-31 ENCOUNTER — Ambulatory Visit
Admission: RE | Admit: 2018-01-31 | Discharge: 2018-01-31 | Disposition: A | Discharge status: Home / Self Care | Payer: Medicare Other | Source: Ambulatory Visit | Attending: Radiation Oncology | Admitting: Radiation Oncology

## 2018-01-31 DIAGNOSIS — Z510 Encounter for antineoplastic radiation therapy: Secondary | ICD-10-CM | POA: Diagnosis not present

## 2018-02-03 ENCOUNTER — Ambulatory Visit
Admission: RE | Admit: 2018-02-03 | Discharge: 2018-02-03 | Disposition: A | Discharge status: Home / Self Care | Payer: Medicare Other | Source: Ambulatory Visit | Attending: Radiation Oncology | Admitting: Radiation Oncology

## 2018-02-03 ENCOUNTER — Encounter: Payer: Self-pay | Admitting: Radiation Oncology

## 2018-02-03 DIAGNOSIS — Z510 Encounter for antineoplastic radiation therapy: Secondary | ICD-10-CM | POA: Diagnosis not present

## 2018-02-10 NOTE — Progress Notes (Signed)
  Radiation Oncology         ([phone]) [phone] ________________________________  Name: Denise Mcclure MRN: [id number]  Date: 02/03/2018  DOB: [birth year]  End of Treatment Note  Diagnosis:   72 y.o. female with ER PR negative, multifocal microinvasive disease arising from Paget's disease and DCIS of the left breast  Indication for treatment:  Curative       Radiation treatment dates:   01/06/2018 - 02/03/2018  Site/dose:   The patient initially received a dose of 42.5 Gy in 17 fractions to the left breast using whole-breast tangent fields. This was delivered using a 3-D conformal technique. The patient then received a boost to the seroma. This delivered an additional 7.5 Gy in 3 fractions using a 3 field photon technique due to the depth of the seroma. The total dose was 50 Gy.  Narrative: The patient tolerated radiation treatment relatively well.   The patient had some expected skin irritation as she progressed during treatment. Moist desquamation was not present at the end of treatment.  Plan: The patient has completed radiation treatment. The patient will return to radiation oncology clinic for routine followup in one month. I advised the patient to call or return sooner if they have any questions or concerns related to their recovery or treatment. ________________________________  Jodelle Gross, MD, PhD  This document serves as a record of services personally performed by Kyung Rudd, MD. It was created on his behalf by Rae Lips, a trained medical scribe. The creation of this record is based on the scribe's personal observations and the provider's statements to them. This document has been checked and approved by the attending provider.

## 2018-05-05 ENCOUNTER — Inpatient Hospital Stay: Payer: Medicare Other | Attending: Hematology | Admitting: Adult Health

## 2018-05-05 NOTE — Progress Notes (Deleted)
[facility]:  Survivorship   REASON FOR VISIT:  Routine follow-up post-treatment for a recent history of breast cancer.  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 The skin thickening and hypervascularity in the region of physical exam abnormality is suspicious for Paget's disease. No mass seen within the breast parenchyma.    08/30/2017 Pathology Results    Diagnosis Breast, biopsy, left   - PAGET'S DISEASE WITH FOCAL SUPERFICIAL INVASION AND DUCTAL CARCINOMA IN SITU INVOLVING LACTIFEROUS DUCTS - DERMAL-EPIDERMAL INTERFACE REACTION WITH LICHENOID INFLAMMATION AND SCATTERED PIGMENTED MELANOPHAGES - ER AND PR WILL BE ORDERED ON BLOCK 1A AND SEPARATELY REPORTED - SEE COMMENT    08/30/2017 Receptors her2    ER, 0% negative and PR, 0% negative, HER2, pending      09/09/2017 Initial Diagnosis    Ductal carcinoma in situ (DCIS) of left breast    09/09/2017 Imaging    BILATERAL BREAST MRI WITH AND WITHOUT CONTRAST IMPRESSION: 1. Known biopsy-proven Paget's disease of the LEFT nipple. There is associated enhancement and thickening of the left areola and periareolar skin. The periareolar skin thickening and enhancement extends approximately 3-3.5 cm medial and lateral to the nipple defining the extent of skin  involvement. 2. Focal non-mass enhancement within the immediate subareolar LEFT breast, measuring 1.9 x 1.5 cm, consistent with direct subareolar extension of patient's biopsy-proven Paget's disease. 3. No evidence of additional multifocal or multicentric disease within the left breast. 4. No evidence of malignancy within the right breast. 5. No evidence of metastatic lymphadenopathy.    12/03/2017 Pathology Results    Breast, lumpectomy, Left - MULTIPLE FOCI OF MICROINVASIVE CARCINOMA ARISING FROM PAGETOID SPREAD OF HIGH-GRADE DUCTAL CARCINOMA IN SITU TO THE NIPPLE AND ADJACENT SKIN. SEE NOTE - PAGETOID SPREAD INVOLVES 4.5 CM OF THE SKIN. - ALL RESECTION MARGINS, INCLUDING THE SKIN, ARE NEGATIVE FOR DCIS AND PAGETOID SPREAD. - NEGATIVE FOR LYMPHOVASCULAR INVASION - BIOPSY SITE CHANGE. - SEE ONCOLOGY TABLE. 2. Lymph node, sentinel, biopsy, Left Axillary #1 - LYMPH NODE, NEGATIVE FOR CARCINOMA (0/1). 3. Lymph node, sentinel, biopsy, Left Axillary #2 - LYMPH NODE, NEGATIVE FOR CARCINOMA (0/1). 4. Lymph node, sentinel, biopsy, Left Axillary #3 - FIBROADIPOSE TISSUE, NEGATIVE FOR CARCINOMA. - LYMPHOID TISSUE IS NOT IDENTIFIED. 5. Lymph node, sentinel, biopsy, Left Axillary #4 - LYMPH NODE, NEGATIVE FOR CARCINOMA (0/1).    12/03/2017 Cancer Staging    Staging form: Breast, AJCC 8th Edition - Pathologic stage from 12/03/2017: pT158m pN0, cM0, G3, ER-, PR-, HER2: Not Assessed - Signed by Denise [facility] on 05/05/2018    01/06/2018 - 02/03/2018 Radiation Therapy     The patient initially received a dose of 42.5 Gy in 17 fractions to the left breast using whole-breast tangent fields. This was delivered using a 3-D conformal technique. The patient then received a boost  to the seroma. This delivered an additional 7.5 Gy in 3 fractions using a 3 field photon technique due to the depth of the seroma. The total dose was 50 Gy.      INTERVAL HISTORY:  Denise [facility] presents to the  Denise [facility] today for our initial meeting to review her survivorship care plan detailing her treatment course for breast cancer, as well as monitoring long-term side effects of that treatment, education regarding health maintenance, screening, and overall wellness and health promotion.     Overall, Denise [facility] reports feeling quite well since completing her radiation therapy approximately 3 months ago.  She ***    REVIEW OF SYSTEMS:  Review of Systems - Oncology Breast: Denies any new nodularity, masses, tenderness, nipple changes, or nipple discharge.      ONCOLOGY TREATMENT TEAM:  1. Surgeon:  Dr. Marland Kitchen at [facility] 2. Medical Oncologist: Dr. Marland Kitchen  3. Radiation Oncologist: Dr. Marland Kitchen    PAST MEDICAL/SURGICAL HISTORY:  Past Medical History:  Diagnosis Date  . Cancer ([place]) 08/2017   left breast cancer  . Hyperlipidemia   . Hypertension   . Smoker    Past Surgical History:  Procedure Laterality Date  . BREAST LUMPECTOMY WITH SENTINEL LYMPH NODE BIOPSY Left 12/03/2017   Procedure: LEFT BREAST CENTRAL LUMPECTOMY WITH SENTINEL LYMPH NODE BX, INJECT BLUE DYE LEFT BREAST;  Surgeon: Denise Mcclure;  Location: [facility];  Service: General;  Laterality: Left;  . NO PAST SURGERIES       ALLERGIES:  No Known Allergies   CURRENT MEDICATIONS:  Outpatient Encounter Medications as of 05/05/2018  Medication Sig  . alendronate (FOSAMAX) 70 MG tablet Take 70 mg by mouth once a week.  . dorzolamide (TRUSOPT) 2 % ophthalmic solution   . ibuprofen (ADVIL,MOTRIN) 200 MG tablet Take 200 mg by mouth every 6 (six) hours as needed (pain).  Denise [facility] Kitchen losartan-hydrochlorothiazide (HYZAAR) 50-12.5 MG tablet Take 1 tablet by mouth daily.  Denise [facility] Kitchen lovastatin (MEVACOR) 10 MG tablet Take 10 mg by mouth daily.  . Multiple Vitamins-Minerals (MULTIVITAMIN PO) Take 1 tablet by mouth daily.  . Omega-3 Fatty Acids (FISH OIL PO) Take 1 tablet by mouth 2 (two) times daily.  Denise [facility] Kitchen zolpidem  (AMBIEN) 10 MG tablet Take 1 tablet by mouth at bedtime as needed.   No facility-administered encounter medications on file as of 05/05/2018.      ONCOLOGIC FAMILY HISTORY:  Family History  Problem Relation Age of Onset  . Diabetes Sister   . Diabetes Brother   . Asthma Mother   . Heart disease Cousin      GENETIC COUNSELING/TESTING: ***  SOCIAL HISTORY:  Denise Mc[facility] is /single/married/divorced/widowed/separated and lives alone/with her spouse/family/friend in (city), [place].  She has (#) children and they live in (city).  Ms. Margulies is currently retired/disabled/working part-time/full-time as ***.  She denies any current or history of tobacco, alcohol, or illicit drug use.     PHYSICAL EXAMINATION:  Vital Signs:  There were no vitals filed for this visit. There were no vitals filed for this visit. General: Well-nourished, well-appearing female in no acute distress.  She is unaccompanied/accompanied in [place] by her ***** today.   HEENT: Head is normocephalic.  Pupils equal and reactive to light. Conjunctivae clear without exudate.  Sclerae anicteric. Oral mucosa is pink, moist.  Oropharynx is pink without lesions or erythema.  Lymph: No cervical, supraclavicular, or infraclavicular lymphadenopathy noted on palpation.  Cardiovascular: Regular rate and rhythm.Denise [facility] Kitchen Respiratory: Clear to auscultation  bilaterally. Chest expansion symmetric; breathing non-labored.  GI: Abdomen soft and round; non-tender, non-distended. Bowel sounds normoactive.  GU: Deferred.  Neuro: No focal deficits. Steady gait.  Psych: Mood and affect normal and appropriate for situation.  Extremities: No edema. MSK: No focal spinal tenderness to palpation.  Full range of motion in bilateral upper extremities Skin: Warm and dry.  LABORATORY DATA:  None for this visit.  DIAGNOSTIC IMAGING:  None for this visit.      ASSESSMENT AND PLAN:  Ms.. Murray is a pleasant 72 y.o. 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  She presents to the Survivorship [facility] for our initial meeting and routine follow-up post-completion of treatment for breast cancer.    1. Left sided breast cancer:  Ms. Odor is continuing to recover from definitive treatment for breast cancer. She will follow-up with her medical oncologist, Dr. Burr Medico in 07/2018 with history and physical exam per surveillance protocol.   Today, a comprehensive survivorship care plan and treatment summary was reviewed with the patient today detailing her breast cancer diagnosis, treatment course, potential late/long-term effects of treatment, appropriate follow-up care with recommendations for the future, and patient education resources.  A copy of this summary, along with a letter will be sent to the patient's primary care provider via mail/fax/In Basket message after today's visit.    #. Problem(s) at Visit______________  #. Bone health:  Given Ms. Floor's age/history of breast cancer, she is at risk for bone demineralization.  Her last DEXA scan was 08/2015, which showed a T score of -2.9 in the left femur.  I will defer to her PCP regarding future bone density testing.  She was given education on specific activities to promote bone health.  #. Cancer screening:  Due to Ms. Cramer's history and her age, she should receive screening for skin cancers, colon cancer, and gynecologic cancers.  The information and recommendations are listed on the patient's comprehensive care plan/treatment summary and were reviewed in detail with the patient.    #. Health maintenance and wellness promotion: Ms. Maya was encouraged to consume 5-7 servings of fruits and vegetables per day. We reviewed the "Nutrition Rainbow" handout, as well as the handout "Take Control of Your Health and Reduce Your Cancer Risk" from the  [place].  She was also encouraged to engage in moderate to vigorous exercise for 30 minutes per day most days of the week. We discussed the [employer] fitness program, which is designed for cancer survivors to help them become more physically fit after cancer treatments.  She was instructed to limit her alcohol consumption and continue to abstain from tobacco use/***was encouraged stop smoking.     #. Support services/counseling: It is not uncommon for this period of the patient's cancer care trajectory to be one of many emotions and stressors.  We discussed an opportunity for her to participate in the next session of [facility] ("Finding Your New Normal") support group series designed for patients after they have completed treatment.   Ms. Jenning was encouraged to take advantage of our many other support services programs, support groups, and/or counseling in coping with her new life as a cancer survivor after completing anti-cancer treatment.  She was offered support today through active listening and expressive supportive counseling.  She was given information regarding our available services and encouraged to contact me with any questions or for help enrolling in any of  our support group/programs.    Dispo:   -Return to cancer center in n12/2019 for f/u with Dr. Burr Medico -Mammogram due in 07/2017 -Follow up with surgery *** -She is welcome to return back to the Survivorship [facility] at any time; no additional follow-up needed at this time.  -Consider referral back to survivorship as a long-term survivor for continued surveillance  A total of (30) minutes of face-to-face time was spent with this patient with greater than 50% of that time in counseling and care-coordination.   Denise [facility], [place] [phone]   Note: PRIMARY CARE PROVIDER Nolene Ebbs, Denise [phone]

## 2018-05-06 ENCOUNTER — Encounter: Payer: Self-pay | Admitting: Adult Health

## 2018-07-29 NOTE — Progress Notes (Signed)
[facility]  Telephone:([phone]) [phone] Fax:([phone]) [phone]  Clinic Follow up Note   Patient Care Team: Nolene Ebbs, MD as PCP - General (Internal Medicine) End, Harrell Gave, MD as PCP - Cardiology (Cardiology) Fanny Skates, MD as Consulting Physician (General Surgery) Truitt Merle, MD as Consulting Physician (Hematology) Kyung Rudd, MD as Consulting Physician (Radiation Oncology) Gardenia Phlegm, NP as Nurse Practitioner (Hematology and Oncology) 07/30/2018  Chief Complaint: F/u on left breast cancer  SUMMARY OF ONCOLOGIC HISTORY: Oncology History   Cancer Staging Cancer of central portion of left female breast [facility]) Staging form: Breast, AJCC 8th Edition - Pathologic stage from 12/03/2017: pT35m, pN0, cM0, G3, ER-, PR-, HER2: Not Assessed - Signed by FTruitt Merle MD on 01/30/2018  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 The skin thickening and hypervascularity in the region of physical exam abnormality is suspicious for Paget's disease. No mass seen within the breast parenchyma.    08/30/2017 Pathology Results    Diagnosis Breast, biopsy, left   - PAGET'S DISEASE WITH FOCAL SUPERFICIAL INVASION AND DUCTAL CARCINOMA IN SITU INVOLVING LACTIFEROUS DUCTS - DERMAL-EPIDERMAL INTERFACE REACTION WITH LICHENOID INFLAMMATION AND SCATTERED PIGMENTED MELANOPHAGES - ER AND PR WILL BE ORDERED ON BLOCK 1A AND SEPARATELY REPORTED - SEE COMMENT    08/30/2017 Receptors her2    ER, 0% negative and PR, 0% negative, HER2, pending      09/09/2017 Initial Diagnosis   Ductal carcinoma in situ (DCIS) of left breast    09/09/2017 Imaging    BILATERAL BREAST MRI WITH AND WITHOUT CONTRAST IMPRESSION: 1. Known biopsy-proven Paget's disease of the LEFT nipple. There is associated enhancement and thickening of the left areola and periareolar skin. The periareolar skin thickening and enhancement extends approximately 3-3.5 cm medial and lateral to the nipple defining the extent of skin involvement. 2. Focal non-mass enhancement within the immediate subareolar LEFT breast, measuring 1.9 x 1.5 cm, consistent with direct subareolar extension of patient's biopsy-proven Paget's disease. 3. No evidence of additional multifocal or multicentric disease within the left breast. 4. No evidence of malignancy within the right breast. 5. No evidence of metastatic lymphadenopathy.    12/03/2017 Pathology Results    Breast, lumpectomy, Left - MULTIPLE FOCI OF MICROINVASIVE CARCINOMA ARISING FROM PAGETOID SPREAD OF HIGH-GRADE DUCTAL CARCINOMA IN SITU TO THE NIPPLE AND ADJACENT SKIN. SEE NOTE - PAGETOID SPREAD INVOLVES 4.5 CM OF THE SKIN. - ALL RESECTION MARGINS, INCLUDING THE SKIN, ARE NEGATIVE FOR DCIS AND PAGETOID SPREAD. - NEGATIVE FOR LYMPHOVASCULAR INVASION - BIOPSY SITE CHANGE. - SEE ONCOLOGY TABLE. 2. Lymph node, sentinel, biopsy, Left Axillary #1 - LYMPH NODE, NEGATIVE FOR CARCINOMA (0/1). 3. Lymph node, sentinel, biopsy, Left Axillary #2 - LYMPH NODE, NEGATIVE FOR CARCINOMA (0/1). 4. Lymph node, sentinel, biopsy, Left Axillary #3 - FIBROADIPOSE TISSUE, NEGATIVE FOR CARCINOMA. - LYMPHOID TISSUE IS NOT IDENTIFIED. 5. Lymph node, sentinel, biopsy, Left Axillary #4 - LYMPH NODE, NEGATIVE FOR CARCINOMA (0/1).    12/03/2017 Cancer Staging    Staging form: Breast, AJCC 8th Edition - Pathologic stage from 12/03/2017: pT162m pN0, cM0, G3, ER-, PR-, HER2: Not  Assessed - Signed by Gardenia Phlegm, NP on 05/05/2018    01/06/2018 - 02/03/2018 Radiation Therapy     The  patient initially received a dose of 42.5 Gy in 17 fractions to the left breast using whole-breast tangent fields. This was delivered using a 3-D conformal technique. The patient then received a boost to the seroma. This delivered an additional 7.5 Gy in 3 fractions using a 3 field photon technique due to the depth of the seroma. The total dose was 50 Gy.     CURRENT THERAPY Surveillance  INTERVAL HISTORY: Denise Mcclure is a 72 y.o. female who is here for follow-up. She completed radiation with Dr. Lisbeth Renshaw on 02/03/2018. Today, she is here with her family member. She is doing well and denies pain at site of surgery. She had good ROM in arms and shoulders. She is eating well and has good energy level.   Pertinent positives and negatives of review of systems are listed and detailed within the above HPI.   REVIEW OF SYSTEMS:   Constitutional: Denies fevers, chills or abnormal weight loss Eyes: Denies blurriness of vision Ears, nose, mouth, throat, and face: Denies mucositis or sore throat Respiratory: Denies cough, dyspnea or wheezes Cardiovascular: Denies palpitation, chest discomfort or lower extremity swelling Gastrointestinal:  Denies nausea, heartburn or change in bowel habits Skin: Denies abnormal skin rashes Lymphatics: Denies new lymphadenopathy or easy bruising Neurological:Denies numbness, tingling or new weaknesses Behavioral/Psych: Mood is stable, no new changes  All other systems were reviewed with the patient and are negative.  MEDICAL HISTORY:  Past Medical History:  Diagnosis Date  . Cancer ([place]) 08/2017   left breast cancer  . Hyperlipidemia   . Hypertension   . Smoker     SURGICAL HISTORY: Past Surgical History:  Procedure Laterality Date  . BREAST LUMPECTOMY WITH SENTINEL LYMPH NODE BIOPSY Left 12/03/2017   Procedure: LEFT BREAST CENTRAL LUMPECTOMY WITH SENTINEL LYMPH NODE BX, INJECT BLUE DYE LEFT BREAST;  Surgeon: Fanny Skates, MD;  Location: [facility];  Service: General;  Laterality: Left;  . NO PAST SURGERIES      I have reviewed the social history and family history with the patient and they are unchanged from previous note.  ALLERGIES:  has No Known Allergies.  MEDICATIONS:  Current Outpatient Medications  Medication Sig Dispense Refill  . alendronate (FOSAMAX) 70 MG tablet Take 70 mg by mouth once a week.  2  . dorzolamide (TRUSOPT) 2 % ophthalmic solution     . ibuprofen (ADVIL,MOTRIN) 200 MG tablet Take 200 mg by mouth every 6 (six) hours as needed (pain).    Marland Kitchen losartan-hydrochlorothiazide (HYZAAR) 50-12.5 MG tablet Take 1 tablet by mouth daily.  1  . lovastatin (MEVACOR) 10 MG tablet Take 10 mg by mouth daily.  3  . Multiple Vitamins-Minerals (MULTIVITAMIN PO) Take 1 tablet by mouth daily.    . Omega-3 Fatty Acids (FISH OIL PO) Take 1 tablet by mouth 2 (two) times daily.    Marland Kitchen zolpidem (AMBIEN) 10 MG tablet Take 1 tablet by mouth at bedtime as needed.  2   No current facility-administered medications for this visit.     PHYSICAL EXAMINATION: ECOG PERFORMANCE STATUS: 0 - Asymptomatic  Vitals:   07/30/18 1055  BP: (!) 156/84  Pulse: 95  Resp: 17  Temp: 98.5 F (36.9 C)  SpO2: 98%   Filed Weights   07/30/18 1055  Weight: 159 lb 8 oz (72.3 kg)    GENERAL:alert, no distress  and comfortable SKIN: skin color, texture, turgor are normal, no rashes or significant lesions EYES: normal, Conjunctiva are pink and non-injected, sclera clear OROPHARYNX:no exudate, no erythema and lips, buccal mucosa, and tongue normal  NECK: supple, thyroid normal size, non-tender, without nodularity LYMPH:  no palpable lymphadenopathy in the cervical, axillary or inguinal LUNGS: clear to auscultation and percussion with normal breathing effort HEART: regular rate & rhythm and no murmurs and no lower extremity edema ABDOMEN:abdomen soft, non-tender and normal bowel sounds Musculoskeletal:no cyanosis of digits and no clubbing    NEURO: alert & oriented x 3 with fluent speech, no focal motor/sensory deficits Breast: (+) right breast skin is mildly red with thickness  LABORATORY DATA:  I have reviewed the data as listed CBC Latest Ref Rng & Units 07/30/2018 12/03/2017 03/21/2015  WBC 4.0 - 10.5 K/uL 10.5 - -  Hemoglobin 12.0 - 15.0 g/dL 12.9 14.3 14.6  Hematocrit 36.0 - 46.0 % 41.5 42.0 43.0  Platelets 150 - 400 K/uL 296 - -     CMP Latest Ref Rng & Units 07/30/2018 12/03/2017 10/11/2017  Glucose 70 - 99 mg/dL 118(H) 85 85  BUN 8 - 23 mg/dL _0 Creatinine 0.44 - 1.00 mg/dL 0.89 0.70 0.76  Sodium 135 - 145 mmol/L 143 139 142  Potassium 3.5 - 5.1 mmol/L 3.6 3.4(L) 4.5  Chloride 98 - 111 mmol/L 106 105 105  CO2 22 - 32 mmol/L 27 - 25  Calcium 8.9 - 10.3 mg/dL 8.8(L) - 9.0  Total Protein 6.5 - 8.1 g/dL 6.9 - -  Total Bilirubin 0.3 - 1.2 mg/dL 0.5 - -  Alkaline Phos 38 - 126 U/L 65 - -  AST 15 - 41 U/L 23 - -  ALT 0 - 44 U/L 19 - -      RADIOGRAPHIC STUDIES: I have personally reviewed the radiological images as listed and agreed with the findings in the report. No results found.   ASSESSMENT & PLAN:  Denise [facility] is a 72 y.o. female with history of  1. Cancer of central portion of lft breast, pTmiN0M0, stage IA, in the background of Paget's disease and ductal carcinoma in situ, ER-/PR- -Diagnosed in 2018. Treated with lumpectomy and radiation. Currently on surveillance. -her risk of recurrence is very low.  -Labs reviewed, CBC showed Hg 12.9 MCV 63.5 and MCH 19.7 RDW 18.2. -Her right breast has an area of mild skin redness and thickness.  No palpable breast mass.  I advised her to monitor for worsening or additional changes to nipple or new palpable masses.  She knows to call me if she has concerns. -She will continue annual breast exams with PCP -I will order a mammogram. She is due this month  -f/u in one year    2. Microcytosis without anemia  -her MCV isin 60's, no anemia. I dicussed  possibility of Thalassemia trait. Her daughter and son are anemic. He doesn't know if she was previously anemic.  -Labs reviewed, CBC showed Hg 12.9 MCV 63.5 and MCH 19.7 RDW 18.2. -I informed her that she might not need iron, but might need Vitamin O53 and folic acid if she is a thalassemia carrier.  -she is not interested in further testing for now    Plan  -will order bilateral mammogram in 2-3 weeks -f/u in one year with labs   No problem-specific Assessment & Plan notes found for this encounter.   Orders Placed This Encounter  Procedures  . MM DIAG BREAST TOMO BILATERAL  Standing Status:   Future    Standing Expiration Date:   07/31/2019    Order Specific Question:   Reason for Exam (SYMPTOM  OR DIAGNOSIS REQUIRED)    Answer:   screening    Order Specific Question:   Preferred imaging location?    Answer:   [facility]   All questions were answered. The patient knows to call the clinic with any problems, questions or concerns. No barriers to learning was detected. I spent 15 minutes counseling the patient face to face. The total time spent in the appointment was 20 minutes and more than 50% was on counseling and review of test results  I, Noor Dweik am acting as scribe for Dr. Truitt Merle.  I have reviewed the above documentation for accuracy and completeness, and I agree with the above.      Truitt Merle, MD 07/30/2018

## 2018-07-30 ENCOUNTER — Encounter: Payer: Self-pay | Admitting: Hematology

## 2018-07-30 ENCOUNTER — Other Ambulatory Visit: Payer: Self-pay | Admitting: Hematology

## 2018-07-30 ENCOUNTER — Telehealth: Payer: Self-pay

## 2018-07-30 ENCOUNTER — Inpatient Hospital Stay: Payer: Medicare Other | Attending: Hematology | Admitting: Hematology

## 2018-07-30 ENCOUNTER — Inpatient Hospital Stay: Payer: Medicare Other

## 2018-07-30 VITALS — BP 156/84 | HR 95 | Temp 98.5°F | Resp 17 | Ht <= 58 in | Wt 159.5 lb

## 2018-07-30 DIAGNOSIS — D7589 Other specified diseases of blood and blood-forming organs: Secondary | ICD-10-CM | POA: Diagnosis not present

## 2018-07-30 DIAGNOSIS — Z171 Estrogen receptor negative status [ER-]: Secondary | ICD-10-CM

## 2018-07-30 DIAGNOSIS — C50112 Malignant neoplasm of central portion of left female breast: Secondary | ICD-10-CM | POA: Diagnosis present

## 2018-07-30 LAB — CMP (CANCER CENTER ONLY)
ALT: 19 U/L (ref 0–44)
AST: 23 U/L (ref 15–41)
Albumin: 4.2 g/dL (ref 3.5–5.0)
Alkaline Phosphatase: 65 U/L (ref 38–126)
Anion gap: 10 (ref 5–15)
BUN: 16 mg/dL (ref 8–23)
CO2: 27 mmol/L (ref 22–32)
Calcium: 8.8 mg/dL — ABNORMAL LOW (ref 8.9–10.3)
Chloride: 106 mmol/L (ref 98–111)
Creatinine: 0.89 mg/dL (ref 0.44–1.00)
Glucose, Bld: 118 mg/dL — ABNORMAL HIGH (ref 70–99)
Potassium: 3.6 mmol/L (ref 3.5–5.1)
SODIUM: 143 mmol/L (ref 135–145)
Total Bilirubin: 0.5 mg/dL (ref 0.3–1.2)
Total Protein: 6.9 g/dL (ref 6.5–8.1)

## 2018-07-30 LAB — CBC WITH DIFFERENTIAL (CANCER CENTER ONLY)
Abs Immature Granulocytes: 0.03 10*3/uL (ref 0.00–0.07)
Basophils Absolute: 0 10*3/uL (ref 0.0–0.1)
Basophils Relative: 0 %
Eosinophils Absolute: 0.2 10*3/uL (ref 0.0–0.5)
Eosinophils Relative: 2 %
HCT: 41.5 % (ref 36.0–46.0)
Hemoglobin: 12.9 g/dL (ref 12.0–15.0)
Immature Granulocytes: 0 %
Lymphocytes Relative: 29 %
Lymphs Abs: 3.1 10*3/uL (ref 0.7–4.0)
MCH: 19.7 pg — ABNORMAL LOW (ref 26.0–34.0)
MCHC: 31.1 g/dL (ref 30.0–36.0)
MCV: 63.5 fL — ABNORMAL LOW (ref 80.0–100.0)
Monocytes Absolute: 0.9 10*3/uL (ref 0.1–1.0)
Monocytes Relative: 8 %
Neutro Abs: 6.3 10*3/uL (ref 1.7–7.7)
Neutrophils Relative %: 61 %
Platelet Count: 296 10*3/uL (ref 150–400)
RBC: 6.54 MIL/uL — ABNORMAL HIGH (ref 3.87–5.11)
RDW: 18.2 % — ABNORMAL HIGH (ref 11.5–15.5)
WBC Count: 10.5 10*3/uL (ref 4.0–10.5)
nRBC: 0 % (ref 0.0–0.2)

## 2018-07-30 NOTE — Telephone Encounter (Signed)
Printed avs and calender of upcoming appointment. Per 12/4 los 

## 2018-08-14 ENCOUNTER — Ambulatory Visit
Admission: RE | Admit: 2018-08-14 | Discharge: 2018-08-14 | Disposition: A | Discharge status: Home / Self Care | Payer: Medicare Other | Source: Ambulatory Visit | Attending: Hematology | Admitting: Hematology

## 2018-08-14 DIAGNOSIS — C50112 Malignant neoplasm of central portion of left female breast: Secondary | ICD-10-CM

## 2018-08-14 DIAGNOSIS — Z171 Estrogen receptor negative status [ER-]: Principal | ICD-10-CM

## 2019-07-20 NOTE — Progress Notes (Signed)
[place]   Telephone:(336) [phone] Fax:(336) [phone]   Clinic Follow up Note   Patient Care Team: Nolene Ebbs, MD as PCP - General (Internal Medicine) End, Harrell Gave, MD as PCP - Cardiology (Cardiology) Fanny Skates, MD as Consulting Physician (General Surgery) Truitt Merle, MD as Consulting Physician (Hematology) Kyung Rudd, MD as Consulting Physician (Radiation Oncology) Gardenia Phlegm, NP as Nurse Practitioner (Hematology and Oncology)  Date of Service:  07/29/2019  CHIEF COMPLAINT: F/u on left breast cancer  SUMMARY OF ONCOLOGIC HISTORY: Oncology History Overview Note  Cancer Staging Cancer of central portion of left female breast [facility]) Staging form: Breast, AJCC 8th Edition - Pathologic stage from 12/03/2017: pT15m, pN0, cM0, G3, ER-, PR-, HER2: Not Assessed - Signed by FTruitt Merle MD on 01/30/2018  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 The skin thickening and hypervascularity in the region of physical exam abnormality is suspicious for Paget's disease. No mass seen within the breast parenchyma.   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 Known biopsy-proven Paget's disease of the LEFT nipple. There is associated enhancement and thickening of the left areola and periareolar skin. The periareolar skin thickening and enhancement extends approximately 3-3.5 cm medial and lateral to the nipple defining the extent of skin involvement. 2. Focal non-mass enhancement within the immediate subareolar LEFT breast, measuring 1.9 x 1.5 cm, consistent with direct subareolar extension of patient's biopsy-proven Paget's disease. 3. No evidence of additional multifocal or multicentric disease within the left breast. 4. No evidence of malignancy within the right breast. 5. No evidence of metastatic lymphadenopathy.   12/03/2017 Pathology Results   Breast, lumpectomy, Left - MULTIPLE FOCI OF MICROINVASIVE CARCINOMA ARISING FROM PAGETOID SPREAD OF HIGH-GRADE DUCTAL CARCINOMA IN SITU TO THE NIPPLE AND ADJACENT SKIN. SEE NOTE - PAGETOID SPREAD INVOLVES 4.5 CM OF THE SKIN. - ALL RESECTION MARGINS, INCLUDING THE SKIN, ARE NEGATIVE FOR DCIS AND PAGETOID SPREAD. - NEGATIVE FOR LYMPHOVASCULAR INVASION - BIOPSY SITE CHANGE. - SEE ONCOLOGY TABLE. 2. Lymph node, sentinel, biopsy, Left Axillary #1 - LYMPH NODE, NEGATIVE FOR CARCINOMA (0/1). 3. Lymph node, sentinel, biopsy, Left Axillary #2 - LYMPH NODE, NEGATIVE FOR CARCINOMA (0/1). 4. Lymph node, sentinel, biopsy, Left Axillary #3 - FIBROADIPOSE TISSUE, NEGATIVE FOR CARCINOMA. - LYMPHOID TISSUE IS NOT IDENTIFIED. 5. Lymph node, sentinel, biopsy, Left Axillary #4 - LYMPH NODE, NEGATIVE FOR CARCINOMA (0/1).   12/03/2017 Cancer Staging   Staging form: Breast, AJCC 8th Edition - Pathologic stage from 12/03/2017: pT116m pN0, cM0, G3, ER-, PR-, HER2: Not Assessed - Signed by CaWilber Bihari  Cornetto, NP on 05/05/2018   01/06/2018 - 02/03/2018 Radiation Therapy     The patient initially received a dose of 42.5 Gy in 17 fractions to the left breast using whole-breast tangent fields. This was delivered using a 3-D conformal technique. The patient then received a boost to the seroma. This delivered an additional 7.5 Gy in 3 fractions using a 3 field photon technique due to the depth of the seroma. The total dose was 50 Gy.       CURRENT THERAPY:  Surveillance  INTERVAL HISTORY:  [name] is here for a follow up of breast cancer. She was last seen by me 1 year ago. She presents to the clinic alone. She notes she is doing well. She has no pain or concerns about her breast. She denies any new changes in the past year with no procedures, surgery, ED visits or change in medications.     REVIEW OF SYSTEMS:   Constitutional: Denies fevers, chills or abnormal weight loss Eyes: Denies blurriness of vision Ears, nose, mouth, throat, and face: Denies mucositis or sore throat Respiratory: Denies cough, dyspnea or wheezes Cardiovascular: Denies palpitation, chest discomfort or lower extremity swelling Gastrointestinal:  Denies nausea, heartburn or change in bowel habits Skin: Denies abnormal skin rashes Lymphatics: Denies new lymphadenopathy or easy bruising Neurological:Denies numbness, tingling or new weaknesses Behavioral/Psych: Mood is stable, no new changes  All other systems were reviewed with the patient and are negative.  MEDICAL HISTORY:  Past Medical History:  Diagnosis Date   Cancer ([place]) 08/2017   left breast cancer   Hyperlipidemia    Hypertension    Smoker     SURGICAL HISTORY: Past Surgical History:  Procedure Laterality Date   BREAST LUMPECTOMY WITH SENTINEL LYMPH NODE BIOPSY Left 12/03/2017   Procedure: LEFT BREAST CENTRAL LUMPECTOMY WITH SENTINEL LYMPH NODE BX, INJECT BLUE DYE LEFT BREAST;  Surgeon: Fanny Skates, MD;  Location: [place];  Service: General;  Laterality: Left;   NO PAST SURGERIES       I have reviewed the social history and family history with the patient and they are unchanged from previous note.  ALLERGIES:  has No Known Allergies.  MEDICATIONS:  Current Outpatient Medications  Medication Sig Dispense Refill   alendronate (FOSAMAX) 70 MG tablet Take 70 mg by mouth once a week.  2   dorzolamide (TRUSOPT) 2 % ophthalmic solution      ibuprofen (ADVIL,MOTRIN) 200 MG tablet Take 200 mg by mouth every 6 (six) hours as needed (pain).     losartan-hydrochlorothiazide (HYZAAR) 50-12.5 MG tablet Take 1 tablet by mouth daily.  1   lovastatin (MEVACOR) 10 MG tablet Take 10 mg by mouth daily.  3   Multiple Vitamins-Minerals (MULTIVITAMIN PO) Take 1 tablet by mouth daily.     Omega-3 Fatty Acids (FISH OIL PO) Take 1 tablet by mouth 2 (two) times daily.     zolpidem (AMBIEN) 10 MG tablet Take 1 tablet by mouth at bedtime as needed.  2   No current facility-administered medications for this visit.     PHYSICAL EXAMINATION: ECOG PERFORMANCE STATUS: 1 - Symptomatic but completely ambulatory  Vitals:   07/29/19 1105 07/29/19 1108  BP: (!) 180/92 (!) 165/92  Pulse: (!) 102   Resp: 20   Temp: 98.3 F (36.8 C)   SpO2: 99%    Filed Weights   07/29/19 1105  Weight: 168 lb 11.2 oz (76.5 kg)    GENERAL:alert, no distress and comfortable SKIN: skin  color, texture, turgor are normal, no rashes or significant lesions EYES: normal, Conjunctiva are pink and non-injected, sclera clear  NECK: supple, thyroid normal size, non-tender, without nodularity LYMPH:  no palpable lymphadenopathy in the cervical, axillary  LUNGS: clear to auscultation and percussion with normal breathing effort HEART: regular rate & rhythm and no murmurs and no lower extremity edema ABDOMEN:abdomen soft, non-tender and normal bowel sounds Musculoskeletal:no cyanosis of digits and no clubbing  NEURO: alert & oriented x 3 with fluent speech, no focal motor/sensory deficits BREAST: S/p left  central lumpectomy: Surgical incision healed well. (+) Mild healing small ulcer from prior skin infection of right breast, healing well No palpable mass, nodules or adenopathy bilaterally. Breast exam benign.   LABORATORY DATA:  I have reviewed the data as listed CBC Latest Ref Rng & Units 07/29/2019 07/30/2018 12/03/2017  WBC 4.0 - 10.5 K/uL 12.7(H) 10.5 -  Hemoglobin 12.0 - 15.0 g/dL 13.8 12.9 14.3  Hematocrit 36.0 - 46.0 % 45.0 41.5 42.0  Platelets 150 - 400 K/uL 316 296 -     CMP Latest Ref Rng & Units 07/29/2019 07/30/2018 12/03/2017  Glucose 70 - 99 mg/dL 98 118(H) 85  BUN 8 - 23 mg/dL '19 16 16  ' Creatinine 0.44 - 1.00 mg/dL 1.12(H) 0.89 0.70  Sodium 135 - 145 mmol/L 142 143 139  Potassium 3.5 - 5.1 mmol/L 3.7 3.6 3.4(L)  Chloride 98 - 111 mmol/L 105 106 105  CO2 22 - 32 mmol/L 27 27 -  Calcium 8.9 - 10.3 mg/dL 9.2 8.8(L) -  Total Protein 6.5 - 8.1 g/dL 7.3 6.9 -  Total Bilirubin 0.3 - 1.2 mg/dL 0.5 0.5 -  Alkaline Phos 38 - 126 U/L 74 65 -  AST 15 - 41 U/L 28 23 -  ALT 0 - 44 U/L 31 19 -      RADIOGRAPHIC STUDIES: I have personally reviewed the radiological images as listed and agreed with the findings in the report. No results found.   ASSESSMENT & PLAN:  Denise Mcclure is a 73 y.o. 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 Treated with left lumpectomy and radiation. Currently on surveillance. -Her risk of recurrence is very low.  -She is clinically doing well. Lab reviewed, her CBC and CMP are within normal limits except mild leukocytosis, elevated lymphocytes, microcytosis, and mildly elevated Cr. Her physical exam and her 07/2018 mammogram were unremarkable. There is no clinical concern for recurrence. -I encouraged her to drink more water. I encouraged her to f/u with PCP to monitor her blood counts.  -Continue surveillance. She is due for  mammogram this month, 07/2019 -F/u in 1 year.   2. Microcytosis without anemia  -Her MCV is in 60's, no anemia. I dicussed possibility of Thalassemia trait. Her daughter and son are anemic. She doesn't know if she was previously anemic.  -I informed her that she might not need iron, but might need Vitamin E83 and folic acid if she is a thalassemia carrier.  -She is not interested in further testing.   3. Elevated BP  -Has been on Hyzaar -Pt notes her BP is elevated in doctor's office.  -I encouraged her to watch BP at home and if still high she should f/u with her PCP as this can effect her kidney function.   Plan  -bilateral mammogram this month  -f/u in one year with labs  No problem-specific Assessment & Plan notes found for this encounter.   Orders Placed This Encounter  Procedures   MM DIAG BREAST TOMO BILATERAL    Standing Status:   Future    Standing Expiration Date:   07/28/2020    Order Specific Question:   Reason for Exam (SYMPTOM  OR DIAGNOSIS REQUIRED)    Answer:   screening    Order Specific Question:   Preferred imaging location?    Answer:   [facility]   All questions were answered. The patient knows to call the clinic with any problems, questions or concerns. No barriers to learning was detected. I spent 15 minutes counseling the patient face to face. The total time spent in the appointment was 20 minutes and more than 50% was on counseling and review of test results     Truitt Merle, MD 07/29/2019   I, Joslyn Devon, am acting as scribe for Truitt Merle, MD.   I have reviewed the above documentation for accuracy and completeness, and I agree with the above.

## 2019-07-29 ENCOUNTER — Inpatient Hospital Stay (HOSPITAL_BASED_OUTPATIENT_CLINIC_OR_DEPARTMENT_OTHER): Payer: Medicare Other | Admitting: Hematology

## 2019-07-29 ENCOUNTER — Encounter: Payer: Self-pay | Admitting: Hematology

## 2019-07-29 ENCOUNTER — Inpatient Hospital Stay: Payer: Medicare Other | Attending: Hematology

## 2019-07-29 ENCOUNTER — Other Ambulatory Visit: Payer: Self-pay

## 2019-07-29 VITALS — BP 165/92 | HR 102 | Temp 98.3°F | Resp 20 | Ht <= 58 in | Wt 168.7 lb

## 2019-07-29 DIAGNOSIS — R7989 Other specified abnormal findings of blood chemistry: Secondary | ICD-10-CM | POA: Insufficient documentation

## 2019-07-29 DIAGNOSIS — C50112 Malignant neoplasm of central portion of left female breast: Secondary | ICD-10-CM

## 2019-07-29 DIAGNOSIS — Z171 Estrogen receptor negative status [ER-]: Secondary | ICD-10-CM | POA: Diagnosis not present

## 2019-07-29 DIAGNOSIS — D7589 Other specified diseases of blood and blood-forming organs: Secondary | ICD-10-CM | POA: Insufficient documentation

## 2019-07-29 DIAGNOSIS — D72829 Elevated white blood cell count, unspecified: Secondary | ICD-10-CM | POA: Insufficient documentation

## 2019-07-29 DIAGNOSIS — R030 Elevated blood-pressure reading, without diagnosis of hypertension: Secondary | ICD-10-CM | POA: Insufficient documentation

## 2019-07-29 LAB — CMP (CANCER CENTER ONLY)
ALT: 31 U/L (ref 0–44)
AST: 28 U/L (ref 15–41)
Albumin: 4.5 g/dL (ref 3.5–5.0)
Alkaline Phosphatase: 74 U/L (ref 38–126)
Anion gap: 10 (ref 5–15)
BUN: 19 mg/dL (ref 8–23)
CO2: 27 mmol/L (ref 22–32)
Calcium: 9.2 mg/dL (ref 8.9–10.3)
Chloride: 105 mmol/L (ref 98–111)
Creatinine: 1.12 mg/dL — ABNORMAL HIGH (ref 0.44–1.00)
GFR, Est AFR Am: 56 mL/min — ABNORMAL LOW (ref >60)
GFR, Estimated: 49 mL/min — ABNORMAL LOW (ref >60)
Glucose, Bld: 98 mg/dL (ref 70–99)
Potassium: 3.7 mmol/L (ref 3.5–5.1)
Sodium: 142 mmol/L (ref 135–145)
Total Bilirubin: 0.5 mg/dL (ref 0.3–1.2)
Total Protein: 7.3 g/dL (ref 6.5–8.1)

## 2019-07-29 LAB — CBC WITH DIFFERENTIAL (CANCER CENTER ONLY)
Abs Immature Granulocytes: 0.05 10*3/uL (ref 0.00–0.07)
Basophils Absolute: 0.1 10*3/uL (ref 0.0–0.1)
Basophils Relative: 1 %
Eosinophils Absolute: 0.2 10*3/uL (ref 0.0–0.5)
Eosinophils Relative: 2 %
HCT: 45 % (ref 36.0–46.0)
Hemoglobin: 13.8 g/dL (ref 12.0–15.0)
Immature Granulocytes: 0 %
Lymphocytes Relative: 34 %
Lymphs Abs: 4.3 10*3/uL — ABNORMAL HIGH (ref 0.7–4.0)
MCH: 19.5 pg — ABNORMAL LOW (ref 26.0–34.0)
MCHC: 30.7 g/dL (ref 30.0–36.0)
MCV: 63.6 fL — ABNORMAL LOW (ref 80.0–100.0)
Monocytes Absolute: 1.4 10*3/uL — ABNORMAL HIGH (ref 0.1–1.0)
Monocytes Relative: 11 %
Neutro Abs: 6.7 10*3/uL (ref 1.7–7.7)
Neutrophils Relative %: 52 %
Platelet Count: 316 10*3/uL (ref 150–400)
RBC: 7.07 MIL/uL — ABNORMAL HIGH (ref 3.87–5.11)
RDW: 18.6 % — ABNORMAL HIGH (ref 11.5–15.5)
WBC Count: 12.7 10*3/uL — ABNORMAL HIGH (ref 4.0–10.5)
nRBC: 0 % (ref 0.0–0.2)

## 2019-07-30 ENCOUNTER — Telehealth: Payer: Self-pay | Admitting: Hematology

## 2019-07-30 NOTE — Telephone Encounter (Signed)
Scheduled appt per 12/2 los.  Spoke with pt daughter and she is aware of the appt date and time

## 2019-08-03 ENCOUNTER — Telehealth: Payer: Self-pay

## 2019-08-03 NOTE — Telephone Encounter (Signed)
Spoke with patient's daughter Ms. Martinique, informed of lab results and recommendation from MD to follow up with PCP concerning uncontrolled HTN.  Daughter voiced understanding and will call PCP for patient.

## 2019-08-03 NOTE — Telephone Encounter (Signed)
-----   Message from Jonnie Finner, RN sent at 08/03/2019  8:56 AM EST -----  ----- Message ----- From: Truitt Merle, MD Sent: 08/01/2019   3:13 PM EST To: [facility]  Please let pt or her daughter know that her Cr is slightly elevated now, normal last year, probably related to her uncontrolled HTN, f/u with PCP.  Thanks  Truitt Merle  08/01/2019

## 2019-08-18 ENCOUNTER — Other Ambulatory Visit: Payer: Self-pay

## 2019-08-18 ENCOUNTER — Ambulatory Visit
Admission: RE | Admit: 2019-08-18 | Discharge: 2019-08-18 | Disposition: A | Discharge status: Home / Self Care | Payer: Medicare Other | Source: Ambulatory Visit | Attending: Hematology | Admitting: Hematology

## 2019-08-18 DIAGNOSIS — Z171 Estrogen receptor negative status [ER-]: Secondary | ICD-10-CM

## 2019-08-18 DIAGNOSIS — C50112 Malignant neoplasm of central portion of left female breast: Secondary | ICD-10-CM

## 2020-04-14 ENCOUNTER — Other Ambulatory Visit: Payer: Self-pay | Admitting: Internal Medicine

## 2020-04-14 DIAGNOSIS — M810 Age-related osteoporosis without current pathological fracture: Secondary | ICD-10-CM

## 2020-05-08 IMAGING — MG DIGITAL DIAGNOSTIC BILAT W/ TOMO W/ CAD
6 of 9 series · 6 of 25 positions shown · non-contrast
Comparison: Previous exam(s).

CLINICAL DATA: Patient presents for a bilateral diagnostic exam due
to a left malignant lumpectomy 1585 for Paget's disease.

EXAM:
DIGITAL DIAGNOSTIC BILATERAL MAMMOGRAM WITH CAD AND TOMO

[L CC]
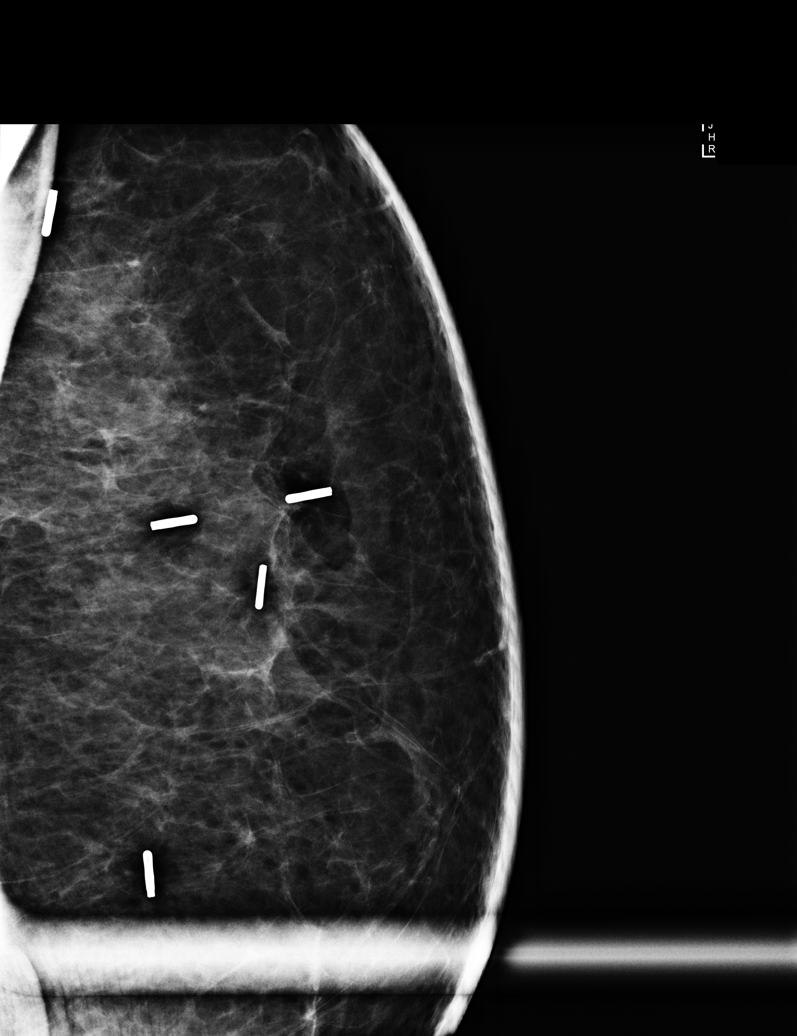

[L CC synth-2D]
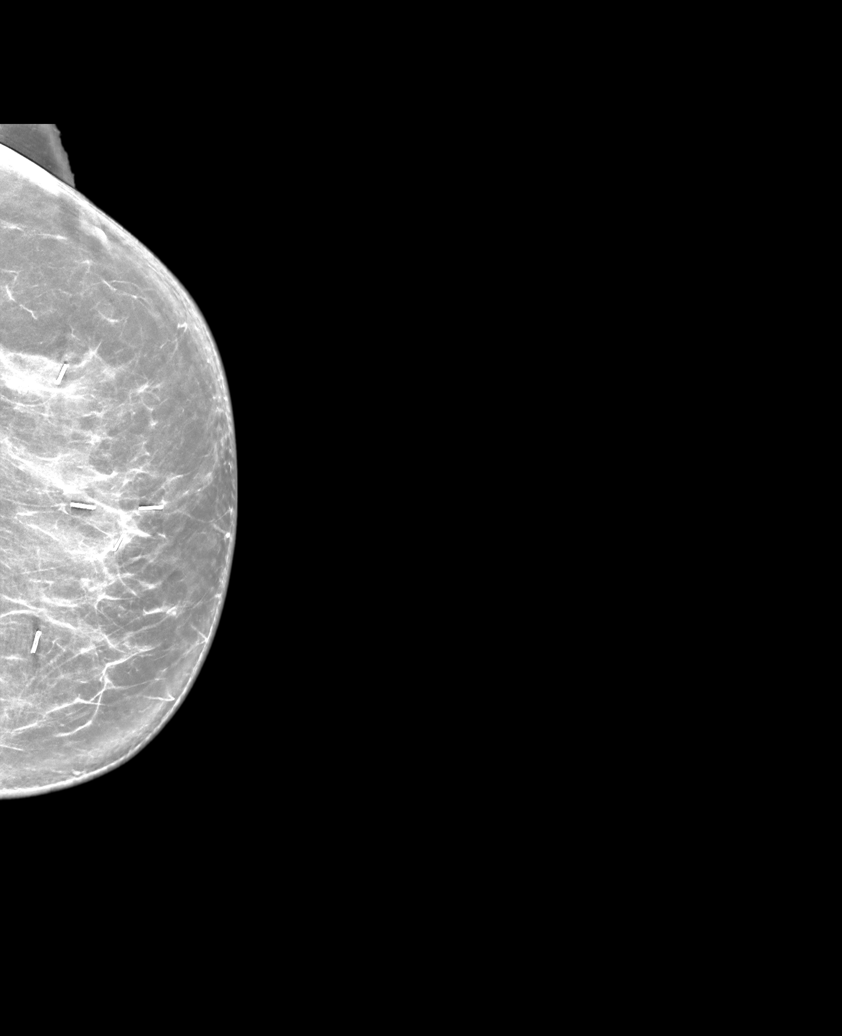

[L MLO synth-2D]
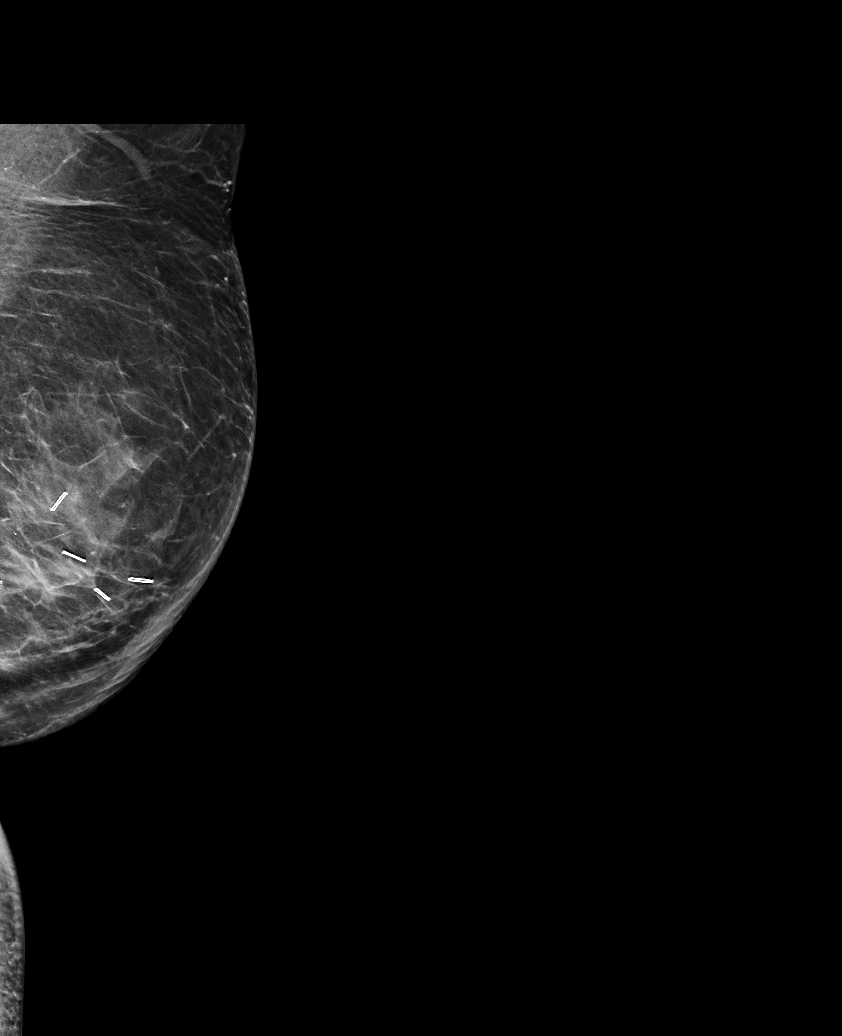

[R CC synth-2D]
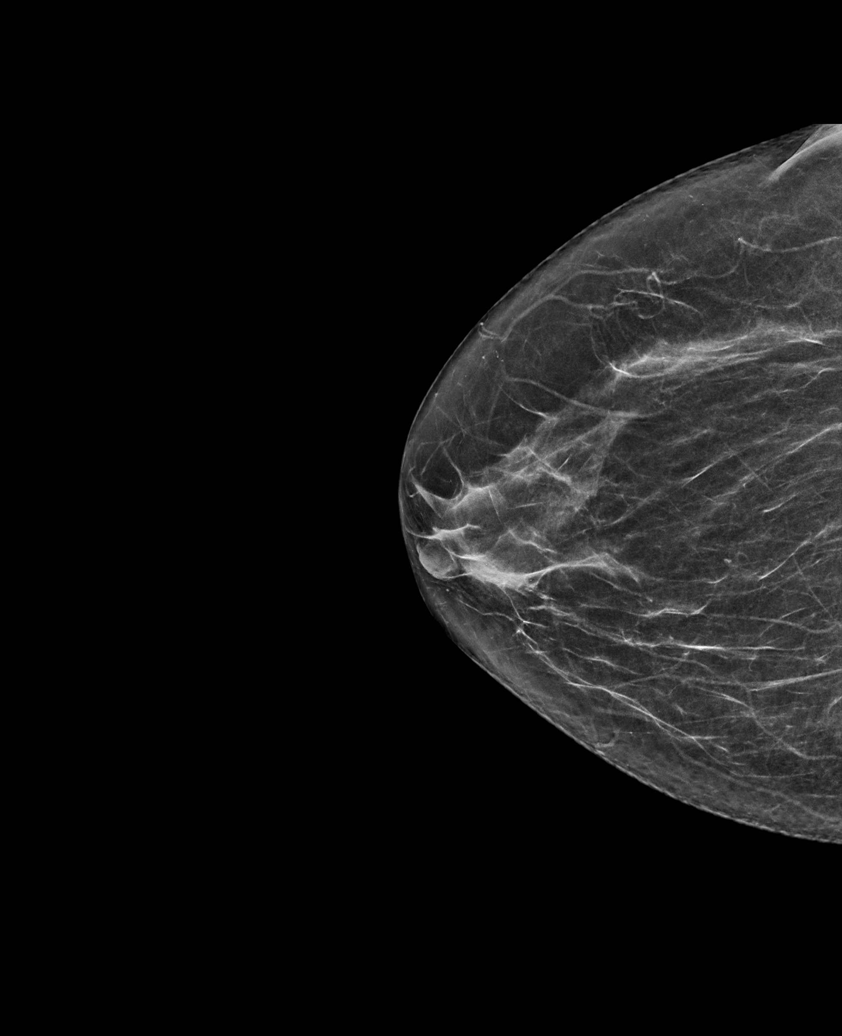

[R MLO synth-2D]
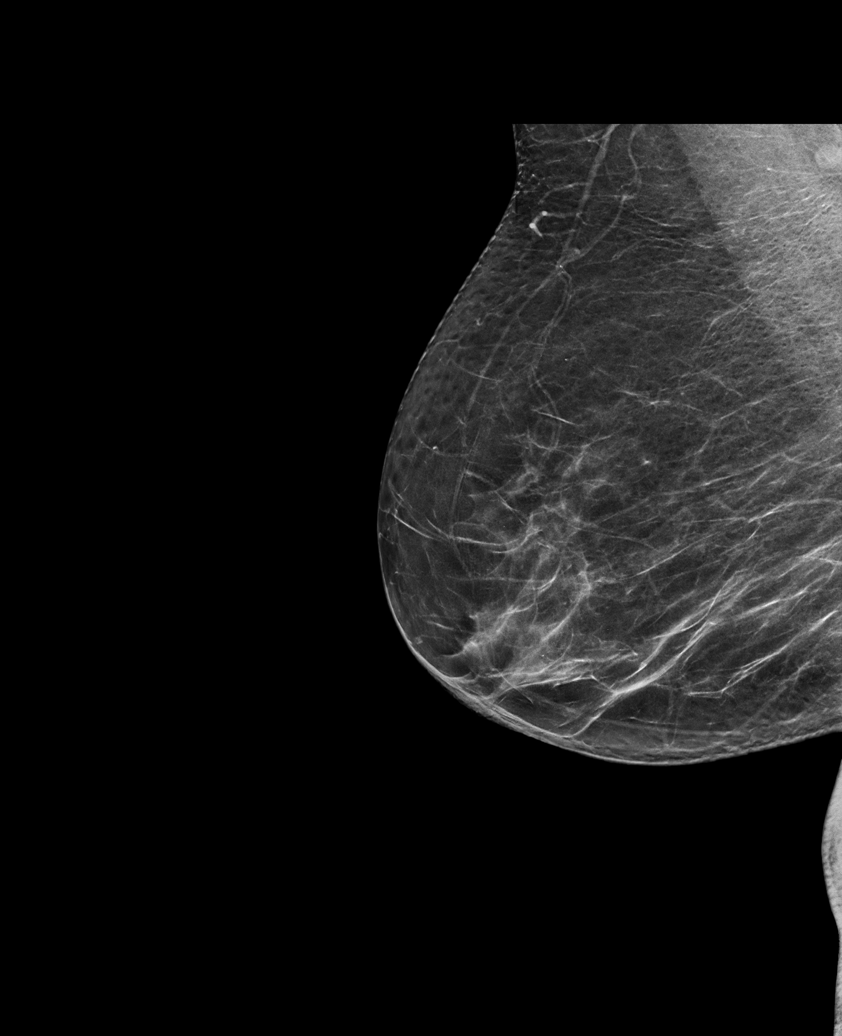

[L CC tomo · tomo slice 38/75.0]
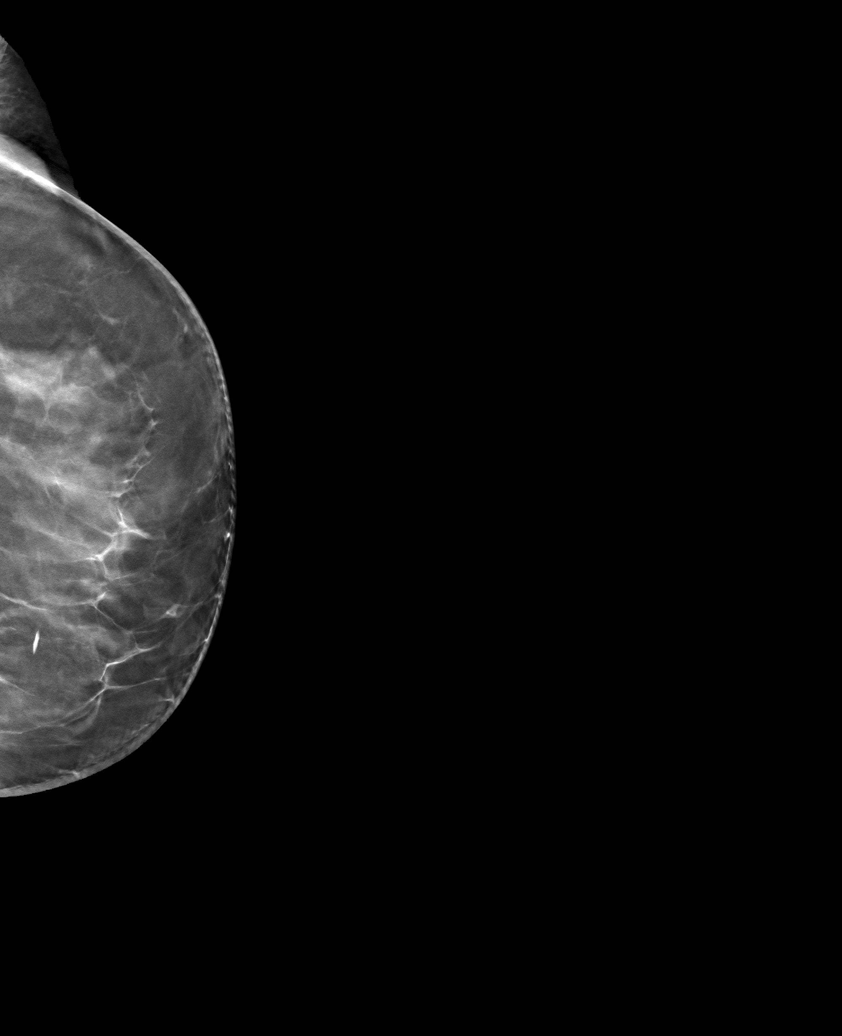

[6 of 25 positions shown; findings below may reference images not displayed]

ACR Breast Density Category b: There are scattered areas of
fibroglandular density.
FINDINGS: Examination demonstrates stable post lumpectomy changes of the
anterior central left breast. Remainder of the left breast as well
as the right breast is unchanged.

Mammographic images were processed with CAD.
IMPRESSION: Stable post lumpectomy changes of the left breast.

RECOMMENDATION:
Recommend continued annual bilateral diagnostic mammographic
evaluation.

I have discussed the findings and recommendations with the patient.
If applicable, a reminder letter will be sent to the patient
regarding the next appointment.

BI-RADS CATEGORY  2: Benign.

## 2020-07-25 ENCOUNTER — Other Ambulatory Visit: Payer: Medicare Other

## 2020-07-27 NOTE — Progress Notes (Signed)
[place]   Telephone:(336) [phone] Fax:(336) [phone]   Clinic Follow up Note   Patient Care Team: Nolene Ebbs, MD as PCP - General (Internal Medicine) End, Harrell Gave, MD as PCP - Cardiology (Cardiology) Fanny Skates, MD as Consulting Physician (General Surgery) Truitt Merle, MD as Consulting Physician (Hematology) Kyung Rudd, MD as Consulting Physician (Radiation Oncology) Gardenia Phlegm, NP as Nurse Practitioner (Hematology and Oncology) 07/28/2020  CHIEF COMPLAINT: Follow up left breast cancer   SUMMARY OF ONCOLOGIC HISTORY: Oncology History Overview Note  Cancer Staging Cancer of central portion of left female breast [facility]) Staging form: Breast, AJCC 8th Edition - Pathologic stage from 12/03/2017: pT81m, pN0, cM0, G3, ER-, PR-, HER2: Not Assessed - Signed by FTruitt Merle MD on 01/30/2018  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 The skin thickening and hypervascularity in the region of physical exam abnormality is suspicious for Paget's disease. No mass seen within the breast parenchyma.   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 Known biopsy-proven Paget's disease of the LEFT nipple. There is associated enhancement and thickening of the left areola and periareolar skin. The periareolar skin thickening and enhancement extends approximately 3-3.5 cm medial and lateral to the nipple defining the extent of skin involvement. 2. Focal non-mass enhancement within the immediate subareolar LEFT breast, measuring 1.9 x 1.5 cm, consistent with direct subareolar extension of patient's biopsy-proven Paget's disease. 3. No evidence of additional multifocal or multicentric disease within the left breast. 4. No evidence of malignancy within the right breast. 5. No evidence of metastatic lymphadenopathy.   12/03/2017 Pathology Results   Breast, lumpectomy, Left - MULTIPLE FOCI OF MICROINVASIVE CARCINOMA ARISING FROM PAGETOID SPREAD OF HIGH-GRADE DUCTAL CARCINOMA IN SITU TO THE NIPPLE AND ADJACENT SKIN. SEE NOTE - PAGETOID SPREAD INVOLVES 4.5 CM OF THE SKIN. - ALL RESECTION MARGINS, INCLUDING THE SKIN, ARE NEGATIVE FOR DCIS AND PAGETOID SPREAD. - NEGATIVE FOR LYMPHOVASCULAR INVASION - BIOPSY SITE CHANGE. - SEE ONCOLOGY TABLE. 2. Lymph node, sentinel, biopsy, Left Axillary #1 - LYMPH NODE, NEGATIVE FOR CARCINOMA (0/1). 3. Lymph node, sentinel, biopsy, Left Axillary #2 - LYMPH NODE, NEGATIVE FOR CARCINOMA (0/1). 4. Lymph node, sentinel, biopsy, Left Axillary #3 - FIBROADIPOSE TISSUE, NEGATIVE FOR CARCINOMA. - LYMPHOID TISSUE IS NOT IDENTIFIED. 5. Lymph node, sentinel, biopsy, Left Axillary #4 - LYMPH NODE, NEGATIVE FOR CARCINOMA (0/1).   12/03/2017 Cancer Staging   Staging form: Breast, AJCC 8th Edition - Pathologic stage from 12/03/2017: pT131m pN0, cM0, G3, ER-, PR-, HER2: Not Assessed - Signed by CaGardenia PhlegmNP on 05/05/2018  01/06/2018 - 02/03/2018 Radiation Therapy    The  patient initially received a dose of 42.5 Gy in 17 fractions to the left breast using whole-breast tangent fields. This was delivered using a 3-D conformal technique. The patient then received a boost to the seroma. This delivered an additional 7.5 Gy in 3 fractions using a 3 field photon technique due to the depth of the seroma. The total dose was 50 Gy.      CURRENT THERAPY: Surveillance   INTERVAL HISTORY: Ms. Wachtel returns for follow up as scheduled. She was last seen in clinic 07/29/2019.  She has not had a mammogram yet this month due to ongoing dental work.  She denies changes in her overall health.  Energy and appetite are normal.  Denies concerns in her breast such as new lump/mass, nipple discharge or inversion, or skin change.  Denies bone/joint pain.  Denies abdominal pain, change in bowel habits, rectal bleeding, recent fever, chills, cough, chest pain, dyspnea.  She had her Covid vaccine series about 6 months ago, planning to get a booster.  She continues follow-up with her primary provider.   MEDICAL HISTORY:  Past Medical History:  Diagnosis Date  . Cancer ([place]) 08/2017   left breast cancer  . Hyperlipidemia   . Hypertension   . Smoker     SURGICAL HISTORY: Past Surgical History:  Procedure Laterality Date  . BREAST LUMPECTOMY WITH SENTINEL LYMPH NODE BIOPSY Left 12/03/2017   Procedure: LEFT BREAST CENTRAL LUMPECTOMY WITH SENTINEL LYMPH NODE BX, INJECT BLUE DYE LEFT BREAST;  Surgeon: Fanny Skates, MD;  Location: [facility];  Service: General;  Laterality: Left;  . NO PAST SURGERIES      I have reviewed the social history and family history with the patient and they are unchanged from previous note.  ALLERGIES:  has No Known Allergies.  MEDICATIONS:  Current Outpatient Medications  Medication Sig Dispense Refill  . alendronate (FOSAMAX) 70 MG tablet Take 70 mg by mouth once a week.  2  . dorzolamide (TRUSOPT) 2 % ophthalmic solution     .  ibuprofen (ADVIL,MOTRIN) 200 MG tablet Take 200 mg by mouth every 6 (six) hours as needed (pain).    Marland Kitchen losartan-hydrochlorothiazide (HYZAAR) 50-12.5 MG tablet Take 1 tablet by mouth daily.  1  . lovastatin (MEVACOR) 10 MG tablet Take 10 mg by mouth daily.  3  . Multiple Vitamins-Minerals (MULTIVITAMIN PO) Take 1 tablet by mouth daily.    . Omega-3 Fatty Acids (FISH OIL PO) Take 1 tablet by mouth 2 (two) times daily.    Marland Kitchen zolpidem (AMBIEN) 10 MG tablet Take 1 tablet by mouth at bedtime as needed.  2   No current facility-administered medications for this visit.    PHYSICAL EXAMINATION: ECOG PERFORMANCE STATUS: 0 - Asymptomatic  Vitals:   07/28/20 0817 07/28/20 0822  BP: (!) 169/92 (!) 160/72  Pulse: 98   Resp: 18   SpO2: 99%    Filed Weights   07/28/20 0817  Weight: 162 lb 12.8 oz (73.8 kg)    GENERAL:alert, no distress and comfortable SKIN: No rash EYES: sclera clear NECK: Without mass LYMPH:  no palpable cervical or supraclavicular lymphadenopathy  LUNGS:  normal breathing effort HEART: no lower extremity edema NEURO: alert & oriented x 3 with fluent speech, no focal motor/sensory deficits Breast exam: S/p left lumpectomy, nipple/areola surgically absent.  Incision completely healed.  Right breast without nipple discharge or inversion.  Few comedones to bilateral breasts.  No palpable  mass or nodularity in either breast or axilla that I could appreciate.  LABORATORY DATA:  I have reviewed the data as listed CBC Latest Ref Rng & Units 07/28/2020 07/29/2019 07/30/2018  WBC 4.0 - 10.5 K/uL 11.2(H) 12.7(H) 10.5  Hemoglobin 12.0 - 15.0 g/dL 12.8 13.8 12.9  Hematocrit 36 - 46 % 41.8 45.0 41.5  Platelets 150 - 400 K/uL 287 316 296     CMP Latest Ref Rng & Units 07/28/2020 07/29/2019 07/30/2018  Glucose 70 - 99 mg/dL 102(H) 98 118(H)  BUN 8 - 23 mg/dL _0 Creatinine 0.44 - 1.00 mg/dL 0.91 1.12(H) 0.89  Sodium 135 - 145 mmol/L 142 142 143  Potassium 3.5 - 5.1 mmol/L 3.7 3.7  3.6  Chloride 98 - 111 mmol/L 105 105 106  CO2 22 - 32 mmol/L _1 Calcium 8.9 - 10.3 mg/dL 8.9 9.2 8.8(L)  Total Protein 6.5 - 8.1 g/dL 6.9 7.3 6.9  Total Bilirubin 0.3 - 1.2 mg/dL 0.5 0.5 0.5  Alkaline Phos 38 - 126 U/L 64 74 65  AST 15 - 41 U/L _2 ALT 0 - 44 U/L _3 RADIOGRAPHIC STUDIES: I have personally reviewed the radiological images as listed and agreed with the findings in the report. No results found.   ASSESSMENT & PLAN: Joell Buerger is a 74 y.o. 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 S/p left lumpectomy and radiation. Currently on surveillance. -Her risk of recurrence is very low. -07/2019 mammogram negative -continue surveillance   2.Microcytosis without anemia -Her MCV is in 60's, no anemia. Dr. Burr Medico previously discussed possibility of Thalassemia trait. Her daughter and son are anemic. She doesn't know if she was previously anemic. -Previously not interested in further testing.  -Stable  3. HTN  -On Hyzaar -Follow-up with PCP  4. Osteoporosis  -last DEXA 2017 showed T score -2.9 at LFN, on fosamax once weekly -managed by PCP Dr. Jeanie Cooks  Disposition: Ms. Sampey is clinically doing well.  Breast exam is benign.  CBC changes are stable, no anemia or other cytopenias, normal differential.  The CMP is unremarkable.  Overall no clinical concern for breast cancer recurrence.  She will continue surveillance.  Her mammogram is due this month, the order has been placed.  She will continue follow-up with her routine providers and return to [place] with lab in 1 year.  She knows to call sooner if she develops a new lump/mass, nipple discharge or inversion, or skin changes in her breast or other related concerns.   Orders Placed This Encounter  Procedures  . MM DIAG BREAST TOMO BILATERAL    Standing  Status:   Future    Standing Expiration Date:   07/28/2021    Order Specific Question:   Reason for Exam (SYMPTOM  OR DIAGNOSIS REQUIRED)    Answer:   h/o left breast cancer    Order Specific Question:   Preferred imaging location?    Answer:   [facility]   All questions were answered. The patient knows to call the clinic with any problems, questions or concerns. No barriers to learning were detected.     Alla Feeling, NP 07/28/20

## 2020-07-28 ENCOUNTER — Telehealth: Payer: Self-pay | Admitting: Nurse Practitioner

## 2020-07-28 ENCOUNTER — Encounter: Payer: Self-pay | Admitting: Nurse Practitioner

## 2020-07-28 ENCOUNTER — Other Ambulatory Visit: Payer: Self-pay

## 2020-07-28 ENCOUNTER — Inpatient Hospital Stay: Payer: Medicare Other | Attending: Nurse Practitioner

## 2020-07-28 ENCOUNTER — Inpatient Hospital Stay (HOSPITAL_BASED_OUTPATIENT_CLINIC_OR_DEPARTMENT_OTHER): Payer: Medicare Other | Admitting: Nurse Practitioner

## 2020-07-28 VITALS — BP 160/72 | HR 98 | Resp 18 | Ht <= 58 in | Wt 162.8 lb

## 2020-07-28 DIAGNOSIS — D7589 Other specified diseases of blood and blood-forming organs: Secondary | ICD-10-CM | POA: Diagnosis not present

## 2020-07-28 DIAGNOSIS — C50112 Malignant neoplasm of central portion of left female breast: Secondary | ICD-10-CM | POA: Insufficient documentation

## 2020-07-28 DIAGNOSIS — Z171 Estrogen receptor negative status [ER-]: Secondary | ICD-10-CM

## 2020-07-28 DIAGNOSIS — I10 Essential (primary) hypertension: Secondary | ICD-10-CM | POA: Insufficient documentation

## 2020-07-28 DIAGNOSIS — M810 Age-related osteoporosis without current pathological fracture: Secondary | ICD-10-CM | POA: Diagnosis not present

## 2020-07-28 DIAGNOSIS — Z87891 Personal history of nicotine dependence: Secondary | ICD-10-CM | POA: Diagnosis not present

## 2020-07-28 LAB — CBC WITH DIFFERENTIAL (CANCER CENTER ONLY)
Abs Immature Granulocytes: 0.05 10*3/uL (ref 0.00–0.07)
Basophils Absolute: 0.1 10*3/uL (ref 0.0–0.1)
Basophils Relative: 0 %
Eosinophils Absolute: 0.2 10*3/uL (ref 0.0–0.5)
Eosinophils Relative: 2 %
HCT: 41.8 % (ref 36.0–46.0)
Hemoglobin: 12.8 g/dL (ref 12.0–15.0)
Immature Granulocytes: 0 %
Lymphocytes Relative: 33 %
Lymphs Abs: 3.7 10*3/uL (ref 0.7–4.0)
MCH: 19.5 pg — ABNORMAL LOW (ref 26.0–34.0)
MCHC: 30.6 g/dL (ref 30.0–36.0)
MCV: 63.5 fL — ABNORMAL LOW (ref 80.0–100.0)
Monocytes Absolute: 1 10*3/uL (ref 0.1–1.0)
Monocytes Relative: 9 %
Neutro Abs: 6.2 10*3/uL (ref 1.7–7.7)
Neutrophils Relative %: 56 %
Platelet Count: 287 10*3/uL (ref 150–400)
RBC: 6.58 MIL/uL — ABNORMAL HIGH (ref 3.87–5.11)
RDW: 18 % — ABNORMAL HIGH (ref 11.5–15.5)
WBC Count: 11.2 10*3/uL — ABNORMAL HIGH (ref 4.0–10.5)
nRBC: 0 % (ref 0.0–0.2)

## 2020-07-28 LAB — CMP (CANCER CENTER ONLY)
ALT: 27 U/L (ref 0–44)
AST: 26 U/L (ref 15–41)
Albumin: 4.1 g/dL (ref 3.5–5.0)
Alkaline Phosphatase: 64 U/L (ref 38–126)
Anion gap: 13 (ref 5–15)
BUN: 20 mg/dL (ref 8–23)
CO2: 24 mmol/L (ref 22–32)
Calcium: 8.9 mg/dL (ref 8.9–10.3)
Chloride: 105 mmol/L (ref 98–111)
Creatinine: 0.91 mg/dL (ref 0.44–1.00)
GFR, Estimated: >60 mL/min (ref >60)
Glucose, Bld: 102 mg/dL — ABNORMAL HIGH (ref 70–99)
Potassium: 3.7 mmol/L (ref 3.5–5.1)
Sodium: 142 mmol/L (ref 135–145)
Total Bilirubin: 0.5 mg/dL (ref 0.3–1.2)
Total Protein: 6.9 g/dL (ref 6.5–8.1)

## 2020-07-28 NOTE — Telephone Encounter (Signed)
Scheduled per los. Gave avs and calendar  

## 2020-10-10 ENCOUNTER — Other Ambulatory Visit: Payer: Self-pay

## 2020-10-10 ENCOUNTER — Ambulatory Visit
Admission: RE | Admit: 2020-10-10 | Discharge: 2020-10-10 | Disposition: A | Discharge status: Home / Self Care | Payer: Medicare Other | Source: Ambulatory Visit | Attending: Nurse Practitioner | Admitting: Nurse Practitioner

## 2020-10-10 DIAGNOSIS — C50112 Malignant neoplasm of central portion of left female breast: Secondary | ICD-10-CM

## 2021-04-11 ENCOUNTER — Other Ambulatory Visit: Payer: Self-pay | Admitting: Internal Medicine

## 2021-04-12 LAB — COMPLETE METABOLIC PANEL WITH GFR
AG Ratio: 2.2 (calc) (ref 1.0–2.5)
ALT: 19 U/L (ref 6–29)
AST: 21 U/L (ref 10–35)
Albumin: 4.6 g/dL (ref 3.6–5.1)
Alkaline phosphatase (APISO): 58 U/L (ref 37–153)
BUN: 19 mg/dL (ref 7–25)
CO2: 24 mmol/L (ref 20–32)
Calcium: 9 mg/dL (ref 8.6–10.4)
Chloride: 104 mmol/L (ref 98–110)
Creat: 0.84 mg/dL (ref 0.60–1.00)
Globulin: 2.1 g/dL (calc) (ref 1.9–3.7)
Glucose, Bld: 63 mg/dL — ABNORMAL LOW (ref 65–99)
Potassium: 4 mmol/L (ref 3.5–5.3)
Sodium: 143 mmol/L (ref 135–146)
Total Bilirubin: 0.7 mg/dL (ref 0.2–1.2)
Total Protein: 6.7 g/dL (ref 6.1–8.1)
eGFR: 72 mL/min/{1.73_m2} (ref >=60)

## 2021-04-12 LAB — LIPID PANEL
Cholesterol: 189 mg/dL (ref <200)
HDL: 47 mg/dL — ABNORMAL LOW (ref >=50)
LDL Cholesterol (Calc): 113 mg/dL (calc) — ABNORMAL HIGH
Non-HDL Cholesterol (Calc): 142 mg/dL (calc) — ABNORMAL HIGH (ref <130)
Total CHOL/HDL Ratio: 4 (calc) (ref <5.0)
Triglycerides: 177 mg/dL — ABNORMAL HIGH (ref <150)

## 2021-04-12 LAB — CBC
HCT: 43.9 % (ref 35.0–45.0)
Hemoglobin: 13.2 g/dL (ref 11.7–15.5)
MCH: 19.7 pg — ABNORMAL LOW (ref 27.0–33.0)
MCHC: 30.1 g/dL — ABNORMAL LOW (ref 32.0–36.0)
MCV: 65.5 fL — ABNORMAL LOW (ref 80.0–100.0)
MPV: 10.8 fL (ref 7.5–12.5)
Platelets: 312 10*3/uL (ref 140–400)
RBC: 6.7 10*6/uL — ABNORMAL HIGH (ref 3.80–5.10)
RDW: 18.5 % — ABNORMAL HIGH (ref 11.0–15.0)
WBC: 9.4 10*3/uL (ref 3.8–10.8)

## 2021-04-12 LAB — TSH: TSH: 1.11 mIU/L (ref 0.40–4.50)

## 2021-07-28 ENCOUNTER — Other Ambulatory Visit: Payer: Medicare Other

## 2021-07-28 ENCOUNTER — Ambulatory Visit: Payer: Medicare Other | Admitting: Hematology

## 2021-08-02 ENCOUNTER — Other Ambulatory Visit: Payer: Medicare Other

## 2021-08-02 ENCOUNTER — Ambulatory Visit: Payer: Medicare Other | Admitting: Hematology

## 2021-08-02 DIAGNOSIS — Z171 Estrogen receptor negative status [ER-]: Secondary | ICD-10-CM

## 2021-08-02 DIAGNOSIS — C50012 Malignant neoplasm of nipple and areola, left female breast: Secondary | ICD-10-CM

## 2022-03-13 ENCOUNTER — Other Ambulatory Visit: Payer: Self-pay | Admitting: Internal Medicine

## 2022-03-14 LAB — LIPID PANEL
Cholesterol: 203 mg/dL — ABNORMAL HIGH (ref <200)
HDL: 46 mg/dL — ABNORMAL LOW (ref >=50)
LDL Cholesterol (Calc): 121 mg/dL (calc) — ABNORMAL HIGH
Non-HDL Cholesterol (Calc): 157 mg/dL (calc) — ABNORMAL HIGH (ref <130)
Total CHOL/HDL Ratio: 4.4 (calc) (ref <5.0)
Triglycerides: 236 mg/dL — ABNORMAL HIGH (ref <150)

## 2022-03-14 LAB — COMPLETE METABOLIC PANEL WITH GFR
AG Ratio: 2 (calc) (ref 1.0–2.5)
ALT: 17 U/L (ref 6–29)
AST: 19 U/L (ref 10–35)
Albumin: 4.4 g/dL (ref 3.6–5.1)
Alkaline phosphatase (APISO): 60 U/L (ref 37–153)
BUN: 16 mg/dL (ref 7–25)
CO2: 27 mmol/L (ref 20–32)
Calcium: 8.8 mg/dL (ref 8.6–10.4)
Chloride: 103 mmol/L (ref 98–110)
Creat: 0.76 mg/dL (ref 0.60–1.00)
Globulin: 2.2 g/dL (calc) (ref 1.9–3.7)
Glucose, Bld: 72 mg/dL (ref 65–99)
Potassium: 4.2 mmol/L (ref 3.5–5.3)
Sodium: 142 mmol/L (ref 135–146)
Total Bilirubin: 0.6 mg/dL (ref 0.2–1.2)
Total Protein: 6.6 g/dL (ref 6.1–8.1)
eGFR: 81 mL/min/{1.73_m2} (ref >=60)

## 2022-03-14 LAB — CBC
HCT: 44.5 % (ref 35.0–45.0)
Hemoglobin: 13.2 g/dL (ref 11.7–15.5)
MCH: 19.5 pg — ABNORMAL LOW (ref 27.0–33.0)
MCHC: 29.7 g/dL — ABNORMAL LOW (ref 32.0–36.0)
MCV: 65.8 fL — ABNORMAL LOW (ref 80.0–100.0)
MPV: 10.7 fL (ref 7.5–12.5)
Platelets: 316 10*3/uL (ref 140–400)
RBC: 6.76 10*6/uL — ABNORMAL HIGH (ref 3.80–5.10)
RDW: 18.4 % — ABNORMAL HIGH (ref 11.0–15.0)
WBC: 9.5 10*3/uL (ref 3.8–10.8)

## 2022-03-14 LAB — TSH: TSH: 1.22 mIU/L (ref 0.40–4.50)

## 2023-01-18 ENCOUNTER — Other Ambulatory Visit: Payer: Self-pay | Admitting: Internal Medicine

## 2023-01-18 DIAGNOSIS — E2839 Other primary ovarian failure: Secondary | ICD-10-CM

## 2023-08-09 ENCOUNTER — Ambulatory Visit
Admission: RE | Admit: 2023-08-09 | Discharge: 2023-08-09 | Disposition: A | Discharge status: Home / Self Care | Payer: Medicare Other | Source: Ambulatory Visit | Attending: Internal Medicine | Admitting: Internal Medicine

## 2023-08-09 DIAGNOSIS — E2839 Other primary ovarian failure: Secondary | ICD-10-CM
